# Patient Record
Sex: Female | Born: 1945
Health system: Southern US, Community
[De-identification: ages and names within clinical notes are randomized; demographics above are authoritative.]

## PROBLEM LIST (undated history)

## (undated) DIAGNOSIS — R569 Unspecified convulsions: Secondary | ICD-10-CM

## (undated) DIAGNOSIS — F32A Depression, unspecified: Secondary | ICD-10-CM

## (undated) DIAGNOSIS — K259 Gastric ulcer, unspecified as acute or chronic, without hemorrhage or perforation: Secondary | ICD-10-CM

## (undated) DIAGNOSIS — D649 Anemia, unspecified: Secondary | ICD-10-CM

## (undated) DIAGNOSIS — E785 Hyperlipidemia, unspecified: Secondary | ICD-10-CM

## (undated) DIAGNOSIS — D329 Benign neoplasm of meninges, unspecified: Secondary | ICD-10-CM

## (undated) DIAGNOSIS — K219 Gastro-esophageal reflux disease without esophagitis: Secondary | ICD-10-CM

## (undated) DIAGNOSIS — F419 Anxiety disorder, unspecified: Secondary | ICD-10-CM

## (undated) DIAGNOSIS — D126 Benign neoplasm of colon, unspecified: Secondary | ICD-10-CM

## (undated) HISTORY — PX: ESOPHAGOGASTRODUODENOSCOPY: SHX1529

## (undated) HISTORY — PX: CHOLECYSTECTOMY: SHX55

## (undated) HISTORY — PX: COLONOSCOPY WITH PROPOFOL: SHX5780

## (undated) HISTORY — PX: FRACTURE SURGERY: SHX138

## (undated) HISTORY — PX: PARTIAL GASTRECTOMY: SHX2172

---

## 2004-05-04 ENCOUNTER — Emergency Department: Payer: Self-pay | Admitting: Unknown Physician Specialty

## 2004-05-22 ENCOUNTER — Ambulatory Visit: Payer: Self-pay

## 2004-09-20 ENCOUNTER — Ambulatory Visit: Payer: Self-pay | Admitting: Internal Medicine

## 2005-05-28 ENCOUNTER — Emergency Department: Payer: Self-pay | Admitting: Emergency Medicine

## 2005-06-03 ENCOUNTER — Emergency Department: Payer: Self-pay | Admitting: Orthopedic Surgery

## 2005-06-12 ENCOUNTER — Ambulatory Visit: Payer: Self-pay | Admitting: Orthopedic Surgery

## 2005-06-25 ENCOUNTER — Encounter: Payer: Self-pay | Admitting: Orthopedic Surgery

## 2005-06-28 ENCOUNTER — Encounter: Payer: Self-pay | Admitting: Orthopedic Surgery

## 2005-07-28 ENCOUNTER — Encounter: Payer: Self-pay | Admitting: Orthopedic Surgery

## 2005-08-28 ENCOUNTER — Encounter: Payer: Self-pay | Admitting: Orthopedic Surgery

## 2005-09-28 ENCOUNTER — Encounter: Payer: Self-pay | Admitting: Orthopedic Surgery

## 2005-10-28 ENCOUNTER — Encounter: Payer: Self-pay | Admitting: Orthopedic Surgery

## 2005-11-20 ENCOUNTER — Ambulatory Visit: Payer: Self-pay | Admitting: Internal Medicine

## 2005-11-22 ENCOUNTER — Ambulatory Visit: Payer: Self-pay | Admitting: Internal Medicine

## 2005-11-28 ENCOUNTER — Encounter: Payer: Self-pay | Admitting: Orthopedic Surgery

## 2005-12-02 ENCOUNTER — Ambulatory Visit: Payer: Self-pay | Admitting: Internal Medicine

## 2006-08-14 ENCOUNTER — Ambulatory Visit: Payer: Self-pay | Admitting: Unknown Physician Specialty

## 2006-11-24 ENCOUNTER — Ambulatory Visit: Payer: Self-pay | Admitting: Internal Medicine

## 2007-11-26 ENCOUNTER — Ambulatory Visit: Payer: Self-pay | Admitting: Internal Medicine

## 2008-04-26 ENCOUNTER — Ambulatory Visit: Payer: Self-pay | Admitting: Internal Medicine

## 2008-08-19 ENCOUNTER — Ambulatory Visit: Payer: Self-pay | Admitting: Internal Medicine

## 2008-09-01 ENCOUNTER — Ambulatory Visit: Payer: Self-pay | Admitting: Neurology

## 2008-11-29 ENCOUNTER — Ambulatory Visit: Payer: Self-pay | Admitting: Internal Medicine

## 2009-11-30 ENCOUNTER — Ambulatory Visit: Payer: Self-pay | Admitting: Internal Medicine

## 2010-12-03 ENCOUNTER — Ambulatory Visit: Payer: Self-pay | Admitting: Internal Medicine

## 2011-11-18 ENCOUNTER — Ambulatory Visit: Payer: Self-pay | Admitting: Unknown Physician Specialty

## 2011-11-20 LAB — PATHOLOGY REPORT

## 2011-12-04 ENCOUNTER — Ambulatory Visit: Payer: Self-pay | Admitting: Internal Medicine

## 2012-08-05 ENCOUNTER — Ambulatory Visit: Payer: Self-pay

## 2012-08-05 LAB — CREATININE, SERUM
Creatinine: 1.25 mg/dL (ref 0.60–1.30)
EGFR (African American): 52 — ABNORMAL LOW
EGFR (Non-African Amer.): 45 — ABNORMAL LOW

## 2012-12-04 ENCOUNTER — Ambulatory Visit: Payer: Self-pay | Admitting: Internal Medicine

## 2012-12-17 ENCOUNTER — Ambulatory Visit: Payer: Self-pay | Admitting: Internal Medicine

## 2013-12-08 ENCOUNTER — Ambulatory Visit: Payer: Self-pay | Admitting: Internal Medicine

## 2013-12-17 ENCOUNTER — Ambulatory Visit: Payer: Self-pay | Admitting: Internal Medicine

## 2014-02-01 DIAGNOSIS — H1132 Conjunctival hemorrhage, left eye: Secondary | ICD-10-CM | POA: Diagnosis not present

## 2014-04-05 DIAGNOSIS — H2513 Age-related nuclear cataract, bilateral: Secondary | ICD-10-CM | POA: Diagnosis not present

## 2014-04-11 DIAGNOSIS — G40309 Generalized idiopathic epilepsy and epileptic syndromes, not intractable, without status epilepticus: Secondary | ICD-10-CM | POA: Diagnosis not present

## 2014-04-11 DIAGNOSIS — E782 Mixed hyperlipidemia: Secondary | ICD-10-CM | POA: Diagnosis not present

## 2014-04-11 DIAGNOSIS — D510 Vitamin B12 deficiency anemia due to intrinsic factor deficiency: Secondary | ICD-10-CM | POA: Diagnosis not present

## 2014-04-11 DIAGNOSIS — Z01810 Encounter for preprocedural cardiovascular examination: Secondary | ICD-10-CM | POA: Diagnosis not present

## 2014-04-14 DIAGNOSIS — H2513 Age-related nuclear cataract, bilateral: Secondary | ICD-10-CM | POA: Diagnosis not present

## 2014-04-20 ENCOUNTER — Ambulatory Visit: Payer: Self-pay | Admitting: Ophthalmology

## 2014-04-20 DIAGNOSIS — Z8673 Personal history of transient ischemic attack (TIA), and cerebral infarction without residual deficits: Secondary | ICD-10-CM | POA: Diagnosis not present

## 2014-04-20 DIAGNOSIS — Z8711 Personal history of peptic ulcer disease: Secondary | ICD-10-CM | POA: Diagnosis not present

## 2014-04-20 DIAGNOSIS — M19041 Primary osteoarthritis, right hand: Secondary | ICD-10-CM | POA: Diagnosis not present

## 2014-04-20 DIAGNOSIS — H2513 Age-related nuclear cataract, bilateral: Secondary | ICD-10-CM | POA: Diagnosis not present

## 2014-04-20 DIAGNOSIS — G43909 Migraine, unspecified, not intractable, without status migrainosus: Secondary | ICD-10-CM | POA: Diagnosis not present

## 2014-04-20 DIAGNOSIS — J40 Bronchitis, not specified as acute or chronic: Secondary | ICD-10-CM | POA: Diagnosis not present

## 2014-04-20 DIAGNOSIS — H2512 Age-related nuclear cataract, left eye: Secondary | ICD-10-CM | POA: Diagnosis not present

## 2014-04-20 DIAGNOSIS — K219 Gastro-esophageal reflux disease without esophagitis: Secondary | ICD-10-CM | POA: Diagnosis not present

## 2014-04-20 DIAGNOSIS — M19042 Primary osteoarthritis, left hand: Secondary | ICD-10-CM | POA: Diagnosis not present

## 2014-04-20 DIAGNOSIS — M19029 Primary osteoarthritis, unspecified elbow: Secondary | ICD-10-CM | POA: Diagnosis not present

## 2014-10-06 DIAGNOSIS — G40309 Generalized idiopathic epilepsy and epileptic syndromes, not intractable, without status epilepticus: Secondary | ICD-10-CM | POA: Diagnosis not present

## 2014-10-06 DIAGNOSIS — D510 Vitamin B12 deficiency anemia due to intrinsic factor deficiency: Secondary | ICD-10-CM | POA: Diagnosis not present

## 2014-10-06 DIAGNOSIS — E782 Mixed hyperlipidemia: Secondary | ICD-10-CM | POA: Diagnosis not present

## 2014-10-13 ENCOUNTER — Other Ambulatory Visit: Payer: Self-pay | Admitting: Internal Medicine

## 2014-10-13 DIAGNOSIS — D510 Vitamin B12 deficiency anemia due to intrinsic factor deficiency: Secondary | ICD-10-CM | POA: Diagnosis not present

## 2014-10-13 DIAGNOSIS — Z0000 Encounter for general adult medical examination without abnormal findings: Secondary | ICD-10-CM | POA: Diagnosis not present

## 2014-10-13 DIAGNOSIS — Z1231 Encounter for screening mammogram for malignant neoplasm of breast: Secondary | ICD-10-CM

## 2014-11-18 DIAGNOSIS — H2511 Age-related nuclear cataract, right eye: Secondary | ICD-10-CM | POA: Diagnosis not present

## 2014-12-12 ENCOUNTER — Other Ambulatory Visit: Payer: Self-pay | Admitting: Internal Medicine

## 2014-12-12 ENCOUNTER — Ambulatory Visit
Admission: RE | Admit: 2014-12-12 | Discharge: 2014-12-12 | Disposition: A | Discharge status: Home / Self Care | Payer: Commercial Managed Care - HMO | Source: Ambulatory Visit | Attending: Internal Medicine | Admitting: Internal Medicine

## 2014-12-12 DIAGNOSIS — Z1231 Encounter for screening mammogram for malignant neoplasm of breast: Secondary | ICD-10-CM | POA: Insufficient documentation

## 2014-12-14 ENCOUNTER — Other Ambulatory Visit: Payer: Self-pay | Admitting: Internal Medicine

## 2014-12-14 DIAGNOSIS — R928 Other abnormal and inconclusive findings on diagnostic imaging of breast: Secondary | ICD-10-CM

## 2014-12-20 ENCOUNTER — Ambulatory Visit
Admission: RE | Admit: 2014-12-20 | Discharge: 2014-12-20 | Disposition: A | Discharge status: Home / Self Care | Payer: Commercial Managed Care - HMO | Source: Ambulatory Visit | Attending: Internal Medicine | Admitting: Internal Medicine

## 2014-12-20 DIAGNOSIS — R928 Other abnormal and inconclusive findings on diagnostic imaging of breast: Secondary | ICD-10-CM | POA: Diagnosis not present

## 2015-04-03 DIAGNOSIS — Z0000 Encounter for general adult medical examination without abnormal findings: Secondary | ICD-10-CM | POA: Diagnosis not present

## 2015-04-03 DIAGNOSIS — D510 Vitamin B12 deficiency anemia due to intrinsic factor deficiency: Secondary | ICD-10-CM | POA: Diagnosis not present

## 2015-04-10 DIAGNOSIS — D510 Vitamin B12 deficiency anemia due to intrinsic factor deficiency: Secondary | ICD-10-CM | POA: Diagnosis not present

## 2015-04-10 DIAGNOSIS — F329 Major depressive disorder, single episode, unspecified: Secondary | ICD-10-CM | POA: Diagnosis not present

## 2015-04-10 DIAGNOSIS — G40309 Generalized idiopathic epilepsy and epileptic syndromes, not intractable, without status epilepticus: Secondary | ICD-10-CM | POA: Diagnosis not present

## 2015-04-10 DIAGNOSIS — F419 Anxiety disorder, unspecified: Secondary | ICD-10-CM | POA: Diagnosis not present

## 2015-10-09 DIAGNOSIS — D510 Vitamin B12 deficiency anemia due to intrinsic factor deficiency: Secondary | ICD-10-CM | POA: Diagnosis not present

## 2015-10-09 DIAGNOSIS — G40309 Generalized idiopathic epilepsy and epileptic syndromes, not intractable, without status epilepticus: Secondary | ICD-10-CM | POA: Diagnosis not present

## 2015-10-09 DIAGNOSIS — E782 Mixed hyperlipidemia: Secondary | ICD-10-CM | POA: Diagnosis not present

## 2015-10-17 DIAGNOSIS — Z23 Encounter for immunization: Secondary | ICD-10-CM | POA: Diagnosis not present

## 2015-10-17 DIAGNOSIS — F3341 Major depressive disorder, recurrent, in partial remission: Secondary | ICD-10-CM | POA: Diagnosis not present

## 2015-10-17 DIAGNOSIS — D510 Vitamin B12 deficiency anemia due to intrinsic factor deficiency: Secondary | ICD-10-CM | POA: Diagnosis not present

## 2015-10-17 DIAGNOSIS — Z0000 Encounter for general adult medical examination without abnormal findings: Secondary | ICD-10-CM | POA: Diagnosis not present

## 2015-10-18 ENCOUNTER — Other Ambulatory Visit: Payer: Self-pay | Admitting: Internal Medicine

## 2015-10-18 DIAGNOSIS — Z1231 Encounter for screening mammogram for malignant neoplasm of breast: Secondary | ICD-10-CM

## 2015-12-13 ENCOUNTER — Ambulatory Visit
Admission: RE | Admit: 2015-12-13 | Discharge: 2015-12-13 | Disposition: A | Discharge status: Home / Self Care | Payer: Commercial Managed Care - HMO | Source: Ambulatory Visit | Attending: Internal Medicine | Admitting: Internal Medicine

## 2015-12-13 DIAGNOSIS — Z1231 Encounter for screening mammogram for malignant neoplasm of breast: Secondary | ICD-10-CM

## 2016-04-09 DIAGNOSIS — Z0000 Encounter for general adult medical examination without abnormal findings: Secondary | ICD-10-CM | POA: Diagnosis not present

## 2016-04-09 DIAGNOSIS — D510 Vitamin B12 deficiency anemia due to intrinsic factor deficiency: Secondary | ICD-10-CM | POA: Diagnosis not present

## 2016-04-22 DIAGNOSIS — G40309 Generalized idiopathic epilepsy and epileptic syndromes, not intractable, without status epilepticus: Secondary | ICD-10-CM | POA: Diagnosis not present

## 2016-04-22 DIAGNOSIS — F3341 Major depressive disorder, recurrent, in partial remission: Secondary | ICD-10-CM | POA: Diagnosis not present

## 2016-04-22 DIAGNOSIS — D510 Vitamin B12 deficiency anemia due to intrinsic factor deficiency: Secondary | ICD-10-CM | POA: Diagnosis not present

## 2016-04-22 DIAGNOSIS — Z0000 Encounter for general adult medical examination without abnormal findings: Secondary | ICD-10-CM | POA: Diagnosis not present

## 2016-10-21 DIAGNOSIS — H26492 Other secondary cataract, left eye: Secondary | ICD-10-CM | POA: Diagnosis not present

## 2016-10-28 DIAGNOSIS — D510 Vitamin B12 deficiency anemia due to intrinsic factor deficiency: Secondary | ICD-10-CM | POA: Diagnosis not present

## 2016-10-28 DIAGNOSIS — G40309 Generalized idiopathic epilepsy and epileptic syndromes, not intractable, without status epilepticus: Secondary | ICD-10-CM | POA: Diagnosis not present

## 2016-11-04 DIAGNOSIS — Z0000 Encounter for general adult medical examination without abnormal findings: Secondary | ICD-10-CM | POA: Diagnosis not present

## 2016-11-04 DIAGNOSIS — E782 Mixed hyperlipidemia: Secondary | ICD-10-CM | POA: Diagnosis not present

## 2016-11-04 DIAGNOSIS — D510 Vitamin B12 deficiency anemia due to intrinsic factor deficiency: Secondary | ICD-10-CM | POA: Diagnosis not present

## 2016-11-05 ENCOUNTER — Other Ambulatory Visit: Payer: Self-pay | Admitting: Internal Medicine

## 2016-11-05 DIAGNOSIS — Z1231 Encounter for screening mammogram for malignant neoplasm of breast: Secondary | ICD-10-CM

## 2016-11-14 DIAGNOSIS — H26492 Other secondary cataract, left eye: Secondary | ICD-10-CM | POA: Diagnosis not present

## 2016-11-14 DIAGNOSIS — H02889 Meibomian gland dysfunction of unspecified eye, unspecified eyelid: Secondary | ICD-10-CM | POA: Diagnosis not present

## 2016-11-14 DIAGNOSIS — H04123 Dry eye syndrome of bilateral lacrimal glands: Secondary | ICD-10-CM | POA: Diagnosis not present

## 2016-11-14 DIAGNOSIS — H25811 Combined forms of age-related cataract, right eye: Secondary | ICD-10-CM | POA: Diagnosis not present

## 2016-12-16 ENCOUNTER — Ambulatory Visit
Admission: RE | Admit: 2016-12-16 | Discharge: 2016-12-16 | Disposition: A | Discharge status: Home / Self Care | Payer: Commercial Managed Care - HMO | Source: Ambulatory Visit | Attending: Internal Medicine | Admitting: Internal Medicine

## 2016-12-16 DIAGNOSIS — Z1231 Encounter for screening mammogram for malignant neoplasm of breast: Secondary | ICD-10-CM | POA: Diagnosis not present

## 2017-01-01 DIAGNOSIS — R109 Unspecified abdominal pain: Secondary | ICD-10-CM | POA: Diagnosis not present

## 2017-01-01 DIAGNOSIS — Z86010 Personal history of colonic polyps: Secondary | ICD-10-CM | POA: Diagnosis not present

## 2017-01-01 DIAGNOSIS — Z8719 Personal history of other diseases of the digestive system: Secondary | ICD-10-CM | POA: Diagnosis not present

## 2017-01-01 DIAGNOSIS — R110 Nausea: Secondary | ICD-10-CM | POA: Diagnosis not present

## 2017-01-01 DIAGNOSIS — Z8711 Personal history of peptic ulcer disease: Secondary | ICD-10-CM | POA: Diagnosis not present

## 2017-01-09 ENCOUNTER — Other Ambulatory Visit: Payer: Self-pay | Admitting: Student

## 2017-01-09 DIAGNOSIS — Z8711 Personal history of peptic ulcer disease: Secondary | ICD-10-CM

## 2017-01-09 DIAGNOSIS — Z8719 Personal history of other diseases of the digestive system: Secondary | ICD-10-CM

## 2017-01-09 DIAGNOSIS — R110 Nausea: Secondary | ICD-10-CM

## 2017-01-09 DIAGNOSIS — R109 Unspecified abdominal pain: Secondary | ICD-10-CM

## 2017-01-17 ENCOUNTER — Ambulatory Visit
Admission: RE | Admit: 2017-01-17 | Discharge: 2017-01-17 | Disposition: A | Discharge status: Home / Self Care | Payer: Medicare HMO | Source: Ambulatory Visit | Attending: Student | Admitting: Student

## 2017-01-17 DIAGNOSIS — Z903 Acquired absence of stomach [part of]: Secondary | ICD-10-CM | POA: Diagnosis not present

## 2017-01-17 DIAGNOSIS — Z8719 Personal history of other diseases of the digestive system: Secondary | ICD-10-CM | POA: Diagnosis not present

## 2017-01-17 DIAGNOSIS — R109 Unspecified abdominal pain: Secondary | ICD-10-CM

## 2017-01-17 DIAGNOSIS — R110 Nausea: Secondary | ICD-10-CM | POA: Diagnosis not present

## 2017-01-17 DIAGNOSIS — Z8711 Personal history of peptic ulcer disease: Secondary | ICD-10-CM | POA: Insufficient documentation

## 2017-01-17 LAB — POCT I-STAT CREATININE: CREATININE: 1.3 mg/dL — AB (ref 0.44–1.00)

## 2017-01-17 MED ORDER — IOPAMIDOL (ISOVUE-300) INJECTION 61%
60.0000 mL | Freq: Once | INTRAVENOUS | Status: AC | PRN
  Administered 2017-01-17: 60 mL via INTRAVENOUS

## 2017-02-05 ENCOUNTER — Other Ambulatory Visit: Payer: Self-pay | Admitting: Student

## 2017-02-05 DIAGNOSIS — K5900 Constipation, unspecified: Secondary | ICD-10-CM | POA: Diagnosis not present

## 2017-02-05 DIAGNOSIS — R1084 Generalized abdominal pain: Secondary | ICD-10-CM | POA: Diagnosis not present

## 2017-02-05 DIAGNOSIS — Z8711 Personal history of peptic ulcer disease: Secondary | ICD-10-CM | POA: Diagnosis not present

## 2017-02-05 DIAGNOSIS — R110 Nausea: Secondary | ICD-10-CM

## 2017-02-05 DIAGNOSIS — R197 Diarrhea, unspecified: Secondary | ICD-10-CM | POA: Diagnosis not present

## 2017-02-05 DIAGNOSIS — Z8719 Personal history of other diseases of the digestive system: Secondary | ICD-10-CM | POA: Diagnosis not present

## 2017-02-06 ENCOUNTER — Other Ambulatory Visit
Admission: RE | Admit: 2017-02-06 | Discharge: 2017-02-06 | Disposition: A | Discharge status: Home / Self Care | Payer: Medicare HMO | Source: Ambulatory Visit | Attending: Student | Admitting: Student

## 2017-02-06 DIAGNOSIS — R197 Diarrhea, unspecified: Secondary | ICD-10-CM | POA: Insufficient documentation

## 2017-02-06 LAB — GASTROINTESTINAL PANEL BY PCR, STOOL (REPLACES STOOL CULTURE)

## 2017-02-06 LAB — C DIFFICILE QUICK SCREEN W PCR REFLEX
C DIFFICILE (CDIFF) INTERP: NOT DETECTED
C DIFFICILE (CDIFF) TOXIN: NEGATIVE
C DIFFICLE (CDIFF) ANTIGEN: NEGATIVE

## 2017-02-18 ENCOUNTER — Ambulatory Visit
Admission: RE | Admit: 2017-02-18 | Discharge: 2017-02-18 | Disposition: A | Discharge status: Home / Self Care | Payer: Medicare HMO | Source: Ambulatory Visit | Attending: Student | Admitting: Student

## 2017-02-18 DIAGNOSIS — Z903 Acquired absence of stomach [part of]: Secondary | ICD-10-CM | POA: Diagnosis not present

## 2017-02-18 DIAGNOSIS — R110 Nausea: Secondary | ICD-10-CM | POA: Insufficient documentation

## 2017-02-18 DIAGNOSIS — R197 Diarrhea, unspecified: Secondary | ICD-10-CM | POA: Insufficient documentation

## 2017-02-18 DIAGNOSIS — R1084 Generalized abdominal pain: Secondary | ICD-10-CM | POA: Diagnosis not present

## 2017-02-18 DIAGNOSIS — R109 Unspecified abdominal pain: Secondary | ICD-10-CM | POA: Diagnosis not present

## 2017-03-24 ENCOUNTER — Encounter
Admission: RE | Disposition: A | Discharge status: Home / Self Care | Payer: Self-pay | Source: Ambulatory Visit | Attending: Unknown Physician Specialty

## 2017-03-24 ENCOUNTER — Ambulatory Visit
Admission: RE | Admit: 2017-03-24 | Discharge: 2017-03-24 | Disposition: A | Discharge status: Home / Self Care | Payer: Medicare HMO | Source: Ambulatory Visit | Attending: Unknown Physician Specialty | Admitting: Unknown Physician Specialty

## 2017-03-24 ENCOUNTER — Ambulatory Visit: Payer: Medicare HMO | Admitting: Anesthesiology

## 2017-03-24 ENCOUNTER — Encounter: Payer: Self-pay | Admitting: *Deleted

## 2017-03-24 DIAGNOSIS — Z79899 Other long term (current) drug therapy: Secondary | ICD-10-CM | POA: Insufficient documentation

## 2017-03-24 DIAGNOSIS — K3189 Other diseases of stomach and duodenum: Secondary | ICD-10-CM | POA: Diagnosis not present

## 2017-03-24 DIAGNOSIS — Z1211 Encounter for screening for malignant neoplasm of colon: Secondary | ICD-10-CM | POA: Diagnosis not present

## 2017-03-24 DIAGNOSIS — K2970 Gastritis, unspecified, without bleeding: Secondary | ICD-10-CM | POA: Diagnosis not present

## 2017-03-24 DIAGNOSIS — K635 Polyp of colon: Secondary | ICD-10-CM | POA: Diagnosis not present

## 2017-03-24 DIAGNOSIS — Z86010 Personal history of colonic polyps: Secondary | ICD-10-CM | POA: Insufficient documentation

## 2017-03-24 DIAGNOSIS — D123 Benign neoplasm of transverse colon: Secondary | ICD-10-CM | POA: Insufficient documentation

## 2017-03-24 DIAGNOSIS — Z8719 Personal history of other diseases of the digestive system: Secondary | ICD-10-CM | POA: Insufficient documentation

## 2017-03-24 DIAGNOSIS — K219 Gastro-esophageal reflux disease without esophagitis: Secondary | ICD-10-CM | POA: Insufficient documentation

## 2017-03-24 DIAGNOSIS — E785 Hyperlipidemia, unspecified: Secondary | ICD-10-CM | POA: Insufficient documentation

## 2017-03-24 DIAGNOSIS — D124 Benign neoplasm of descending colon: Secondary | ICD-10-CM | POA: Diagnosis not present

## 2017-03-24 DIAGNOSIS — K2950 Unspecified chronic gastritis without bleeding: Secondary | ICD-10-CM | POA: Diagnosis not present

## 2017-03-24 HISTORY — DX: Anemia, unspecified: D64.9

## 2017-03-24 HISTORY — DX: Unspecified convulsions: R56.9

## 2017-03-24 HISTORY — DX: Hyperlipidemia, unspecified: E78.5

## 2017-03-24 HISTORY — DX: Benign neoplasm of meninges, unspecified: D32.9

## 2017-03-24 HISTORY — DX: Benign neoplasm of colon, unspecified: D12.6

## 2017-03-24 HISTORY — PX: COLONOSCOPY WITH PROPOFOL: SHX5780

## 2017-03-24 HISTORY — DX: Gastric ulcer, unspecified as acute or chronic, without hemorrhage or perforation: K25.9

## 2017-03-24 HISTORY — PX: ESOPHAGOGASTRODUODENOSCOPY (EGD) WITH PROPOFOL: SHX5813

## 2017-03-24 SURGERY — ESOPHAGOGASTRODUODENOSCOPY (EGD) WITH PROPOFOL
Anesthesia: General

## 2017-03-24 MED ORDER — SODIUM CHLORIDE 0.9 % IV SOLN: INTRAVENOUS | Status: DC

## 2017-03-24 MED ORDER — PROPOFOL 10 MG/ML IV BOLUS
INTRAVENOUS | Status: DC | PRN
  Administered 2017-03-24: 20 mg via INTRAVENOUS
  Administered 2017-03-24: 40 mg via INTRAVENOUS

## 2017-03-24 MED ORDER — PROPOFOL 500 MG/50ML IV EMUL
INTRAVENOUS | Status: DC | PRN
  Administered 2017-03-24: 150 ug/kg/min via INTRAVENOUS

## 2017-03-24 MED ORDER — LIDOCAINE HCL (CARDIAC) 20 MG/ML IV SOLN
INTRAVENOUS | Status: DC | PRN
  Administered 2017-03-24: 40 mg via INTRAVENOUS

## 2017-03-24 MED ORDER — SODIUM CHLORIDE 0.9 % IV SOLN
INTRAVENOUS | Status: DC
  Administered 2017-03-24 (×2): via INTRAVENOUS

## 2017-03-24 MED ORDER — MIDAZOLAM HCL 2 MG/2ML IJ SOLN
INTRAMUSCULAR | Status: DC | PRN
  Administered 2017-03-24: 1 mg via INTRAVENOUS

## 2017-03-24 MED ORDER — EPHEDRINE SULFATE 50 MG/ML IJ SOLN
INTRAMUSCULAR | Status: DC | PRN
  Administered 2017-03-24: 10 mg via INTRAVENOUS

## 2017-03-24 MED ORDER — FENTANYL CITRATE (PF) 100 MCG/2ML IJ SOLN
INTRAMUSCULAR | Status: DC | PRN
  Administered 2017-03-24: 50 ug via INTRAVENOUS

## 2017-03-24 MED ORDER — PHENYLEPHRINE HCL 10 MG/ML IJ SOLN
INTRAMUSCULAR | Status: DC | PRN
  Administered 2017-03-24 (×4): 100 ug via INTRAVENOUS

## 2017-03-24 MED ORDER — MIDAZOLAM HCL 2 MG/2ML IJ SOLN
INTRAMUSCULAR | Status: AC
  Filled 2017-03-24: qty 2

## 2017-03-24 MED ORDER — FENTANYL CITRATE (PF) 100 MCG/2ML IJ SOLN
INTRAMUSCULAR | Status: AC
  Filled 2017-03-24: qty 2

## 2017-03-24 MED ORDER — PROPOFOL 500 MG/50ML IV EMUL
INTRAVENOUS | Status: AC
  Filled 2017-03-24: qty 50

## 2017-03-24 NOTE — Op Note (Signed)
[facility] Gastroenterology Patient Name: Donna Richardson Procedure Date: 03/24/2017 3:03 PM MRN: [id number] Account #: [id number] Date of Birth: 03/31/1945 Admit Type: Outpatient Age: 72 Room: [facility] ENDO ROOM 3 Gender: Female Note Status: Finalized Procedure:            Upper GI endoscopy Indications:          Abdominal pain in the left upper quadrant Providers:            Manya Silvas, MD Referring MD:         Rusty Aus, MD (Referring MD) Medicines:            Propofol per Anesthesia Complications:        No immediate complications. Procedure:            Pre-Anesthesia Assessment:                       - After reviewing the risks and benefits, the patient                        was deemed in satisfactory condition to undergo the                        procedure.                       After obtaining informed consent, the endoscope was                        passed under direct vision. Throughout the procedure,                        the patient's blood pressure, pulse, and oxygen                        saturations were monitored continuously. The Endoscope                        was introduced through the mouth, and advanced to the                        jejunum. The upper GI endoscopy was accomplished                        without difficulty. The patient tolerated the procedure                        well. Findings:      The examined esophagus was normal. GEJ 39cm.      Diffuse mild inflammation characterized by erythema and granularity was       found in the gastric body. Merely washing with the scoipe button made       the mucosal lining bleed some. Biopsies were taken with a cold forceps       for histology. Biopsies were taken with a cold forceps for Helicobacter       pylori testing. The small intestine was normal. Impression:           - Normal esophagus.                       - Gastritis. Biopsied. Recommendation:       -  Await pathology  results. Manya Silvas, MD 03/24/2017 3:25:56 PM This report has been signed electronically. Number of Addenda: 0 Note Initiated On: 03/24/2017 3:03 PM      [facility]

## 2017-03-24 NOTE — Anesthesia Postprocedure Evaluation (Signed)
Anesthesia Post Note  Patient: Donna Richardson  Procedure(s) Performed: ESOPHAGOGASTRODUODENOSCOPY (EGD) WITH PROPOFOL (N/A ) COLONOSCOPY WITH PROPOFOL (N/A )  Patient location during evaluation: PACU Anesthesia Type: General Level of consciousness: awake and alert and oriented Pain management: pain level controlled Vital Signs Assessment: post-procedure vital signs reviewed and stable Respiratory status: spontaneous breathing Cardiovascular status: blood pressure returned to baseline Anesthetic complications: no     Last Vitals:  Vitals:   03/24/17 1620 03/24/17 1640  BP: 116/60 (!) 77/64  Pulse: 78 76  Resp: (!) 21 15  Temp:    SpO2: 100% 100%    Last Pain:  Vitals:   03/24/17 1601  TempSrc: Tympanic                 Kalis Friese

## 2017-03-24 NOTE — Transfer of Care (Signed)
Immediate Anesthesia Transfer of Care Note  Patient: Donna Richardson  Procedure(s) Performed: ESOPHAGOGASTRODUODENOSCOPY (EGD) WITH PROPOFOL (N/A ) COLONOSCOPY WITH PROPOFOL (N/A )  Patient Location: Endoscopy Unit  Anesthesia Type:General  Level of Consciousness: awake  Airway & Oxygen Therapy: Patient connected to nasal cannula oxygen  Post-op Assessment: Post -op Vital signs reviewed and stable  Post vital signs: stable  Last Vitals:  Vitals:   03/24/17 1600 03/24/17 1601  BP: (!) 99/40 (!) 99/40  Pulse: 77 78  Resp: 18 15  Temp: (!) 35.9 C (!) 35.9 C  SpO2: 100% 100%    Last Pain:  Vitals:   03/24/17 1601  TempSrc: Tympanic         Complications: No apparent anesthesia complications

## 2017-03-24 NOTE — Op Note (Signed)
[facility] Gastroenterology Patient Name: Donna Richardson Procedure Date: 03/24/2017 3:03 PM MRN: [id number] Account #: [id number] Date of Birth: 04/13/1945 Admit Type: Outpatient Age: 72 Room: [facility] ENDO ROOM 3 Gender: Female Note Status: Finalized Procedure:            Colonoscopy Indications:          High risk colon cancer surveillance: Personal history                        of colonic polyps Providers:            Manya Silvas, MD Referring MD:         Rusty Aus, MD (Referring MD) Medicines:            Propofol per Anesthesia Complications:        No immediate complications. Procedure:            Pre-Anesthesia Assessment:                       - After reviewing the risks and benefits, the patient                        was deemed in satisfactory condition to undergo the                        procedure.                       After obtaining informed consent, the colonoscope was                        passed under direct vision. Throughout the procedure,                        the patient's blood pressure, pulse, and oxygen                        saturations were monitored continuously. The                        Colonoscope was introduced through the anus and                        advanced to the the cecum, identified by appendiceal                        orifice and ileocecal valve. The colonoscopy was                        performed without difficulty. The patient tolerated the                        procedure well. The quality of the bowel preparation                        was good. Findings:      A small polyp was found in the descending colon. The polyp was sessile.       The polyp was removed with a hot snare. Resection and retrieval were       complete.      A small polyp was found  in the transverse colon. The polyp was sessile.       The polyp was removed with a hot snare. Resection and retrieval were       complete. Impression:            - One small polyp in the descending colon, removed with                        a hot snare. Resected and retrieved.                       - One small polyp in the transverse colon, removed with                        a hot snare. Resected and retrieved. Recommendation:       - Await pathology results. Manya Silvas, MD 03/24/2017 3:58:54 PM This report has been signed electronically. Number of Addenda: 0 Note Initiated On: 03/24/2017 3:03 PM Scope Withdrawal Time: 0 hours 12 minutes 55 seconds  Total Procedure Duration: 0 hours 27 minutes 41 seconds       [facility]

## 2017-03-24 NOTE — Anesthesia Post-op Follow-up Note (Signed)
Anesthesia QCDR form completed.        

## 2017-03-24 NOTE — Anesthesia Preprocedure Evaluation (Addendum)
Anesthesia Evaluation  Patient identified by MRN, date of birth, ID band Patient awake    Reviewed: Allergy & Precautions, NPO status , Patient's Chart, lab work & pertinent test results  History of Anesthesia Complications Negative for: history of anesthetic complications  Airway Mallampati: III       Dental  (+) Lower Dentures, Upper Dentures   Pulmonary neg sleep apnea, neg COPD,           Cardiovascular (-) hypertension(-) Past MI and (-) CHF (-) dysrhythmias (-) Valvular Problems/Murmurs     Neuro/Psych Seizures - (last 9 years ago), Well Controlled,     GI/Hepatic Neg liver ROS, PUD, GERD  Medicated and Controlled,  Endo/Other  neg diabetes  Renal/GU negative Renal ROS     Musculoskeletal   Abdominal   Peds  Hematology  (+) anemia ,   Anesthesia Other Findings   Reproductive/Obstetrics                            Anesthesia Physical Anesthesia Plan  ASA: II  Anesthesia Plan: General   Post-op Pain Management:    Induction: Intravenous  PONV Risk Score and Plan: 3 and TIVA, Propofol infusion and Ondansetron  Airway Management Planned: Nasal Cannula  Additional Equipment:   Intra-op Plan:   Post-operative Plan:   Informed Consent: I have reviewed the patients History and Physical, chart, labs and discussed the procedure including the risks, benefits and alternatives for the proposed anesthesia with the patient or authorized representative who has indicated his/her understanding and acceptance.     Plan Discussed with:   Anesthesia Plan Comments:         Anesthesia Quick Evaluation

## 2017-03-24 NOTE — H&P (Signed)
Primary Care Physician:  Rusty Aus, MD Primary Gastroenterologist:  Dr. Vira Agar  Pre-Procedure History & Physical: HPI:  Donna Richardson is a 72 y.o. female is here for an colonoscopy and an EGD.  This is for abdominal pain and nausea and PH colon polyps.   Past Medical History:  Diagnosis Date  . Anemia   . Colon adenomas   . Gastric ulcer   . Hyperlipidemia   . Meningioma ([place])   . Seizures ([place])     Past Surgical History:  Procedure Laterality Date  . CHOLECYSTECTOMY    . COLONOSCOPY WITH PROPOFOL    . ESOPHAGOGASTRODUODENOSCOPY      Prior to Admission medications   Medication Sig Start Date End Date Taking? Authorizing Provider  amitriptyline (ELAVIL) 25 MG tablet Take 25 mg by mouth at bedtime.   Yes [provider]  Ascorbic Acid (VITAMIN C) 1000 MG tablet Take 1,000 mg by mouth daily.   Yes [provider]  Calcium Carbonate-Vitamin D (CALTRATE 600+D PO) Take by mouth 2 (two) times daily.   Yes [provider]  Cyanocobalamin (VITAMIN B-12) 2500 MCG SUBL Place under the tongue daily.   Yes [provider]  cyclobenzaprine (FLEXERIL) 10 MG tablet Take 10 mg by mouth daily.   Yes [provider]  fluticasone (FLONASE) 50 MCG/ACT nasal spray Place into both nostrils daily.   Yes [provider]  ondansetron (ZOFRAN) 4 MG tablet Take 4 mg by mouth every 8 (eight) hours as needed for nausea or vomiting.   Yes [provider]  potassium chloride SA (K-DUR,KLOR-CON) 20 MEQ tablet Take 20 mEq by mouth daily.   Yes [provider]  propranolol (INDERAL) 40 MG tablet Take 40 mg by mouth daily.   Yes [provider]  topiramate (TOPAMAX) 50 MG tablet Take 50 mg by mouth 2 (two) times daily.   Yes [provider]  vitamin E 1000 UNIT capsule Take 1,000 Units by mouth daily.   Yes [provider]  zolpidem (AMBIEN) 10 MG tablet Take 10 mg by mouth at bedtime.   Yes [provider]  pantoprazole (PROTONIX) 40 MG tablet Take 40 mg by mouth 2 (two) times daily.    [provider]  ranitidine (ZANTAC) 150 MG tablet Take 300 mg by mouth at bedtime.    [provider]    Allergies as of 01/13/2017  . (Not on File)    Family History  Problem Relation Age of Onset  . Breast cancer Sister 35    Social History   Socioeconomic History  . Marital status: Single    Spouse name: Not on file  . Number of children: Not on file  . Years of education: Not on file  . Highest education level: Not on file  Social Needs  . Financial resource strain: Not on file  . Food insecurity - worry: Not on file  . Food insecurity - inability: Not on file  . Transportation needs - medical: Not on file  . Transportation needs - non-medical: Not on file  Occupational History  . Not on file  Tobacco Use  . Smoking status: Never Smoker  . Smokeless tobacco: Never Used  Substance and Sexual Activity  . Alcohol use: No    Frequency: Never  . Drug use: No  . Sexual activity: Not on file  Other Topics Concern  . Not on file  Social History Narrative  . Not on file    Review  of Systems: See HPI, otherwise negative ROS  Physical Exam: BP (!) 133/47   Pulse 80   Temp 97.9 F (36.6 C) (Oral)   Resp 16   Ht 5\' 5"  (1.651 m)   Wt 51.3 kg (113 lb)   SpO2 100%   BMI 18.80 kg/m  General:   Alert,  pleasant and cooperative in NAD Head:  Normocephalic and atraumatic. Neck:  Supple; no masses or thyromegaly. Lungs:  Clear throughout to auscultation.    Heart:  Regular rate and rhythm. Abdomen:  Soft, nontender and nondistended. Normal bowel sounds, without guarding, and without rebound.   Neurologic:  Alert and  oriented x4;  grossly normal neurologically.  Impression/Plan: Donna Richardson is here for an endoscopy and colonoscopy to be performed for L abdominal pain,, nausea, PH colon polyps  Risks, benefits, limitations, and alternatives  regarding  Endoscopy and  colonoscopy have been reviewed with the patient.  Questions have been answered.  All parties agreeable.   Gaylyn Cheers, MD  03/24/2017, 3:07 PM

## 2017-03-25 ENCOUNTER — Encounter: Payer: Self-pay | Admitting: Unknown Physician Specialty

## 2017-03-28 LAB — SURGICAL PATHOLOGY

## 2017-04-28 DIAGNOSIS — D510 Vitamin B12 deficiency anemia due to intrinsic factor deficiency: Secondary | ICD-10-CM | POA: Diagnosis not present

## 2017-05-05 DIAGNOSIS — Z79899 Other long term (current) drug therapy: Secondary | ICD-10-CM | POA: Diagnosis not present

## 2017-05-05 DIAGNOSIS — D510 Vitamin B12 deficiency anemia due to intrinsic factor deficiency: Secondary | ICD-10-CM | POA: Diagnosis not present

## 2017-05-05 DIAGNOSIS — K2970 Gastritis, unspecified, without bleeding: Secondary | ICD-10-CM | POA: Diagnosis not present

## 2017-05-05 DIAGNOSIS — E782 Mixed hyperlipidemia: Secondary | ICD-10-CM | POA: Diagnosis not present

## 2017-05-05 DIAGNOSIS — R110 Nausea: Secondary | ICD-10-CM | POA: Diagnosis not present

## 2017-05-05 DIAGNOSIS — G40309 Generalized idiopathic epilepsy and epileptic syndromes, not intractable, without status epilepticus: Secondary | ICD-10-CM | POA: Diagnosis not present

## 2017-05-05 DIAGNOSIS — Z0000 Encounter for general adult medical examination without abnormal findings: Secondary | ICD-10-CM | POA: Diagnosis not present

## 2017-06-27 DIAGNOSIS — E538 Deficiency of other specified B group vitamins: Secondary | ICD-10-CM | POA: Diagnosis not present

## 2017-07-28 DIAGNOSIS — D510 Vitamin B12 deficiency anemia due to intrinsic factor deficiency: Secondary | ICD-10-CM | POA: Diagnosis not present

## 2017-10-30 DIAGNOSIS — Z79899 Other long term (current) drug therapy: Secondary | ICD-10-CM | POA: Diagnosis not present

## 2017-10-30 DIAGNOSIS — D510 Vitamin B12 deficiency anemia due to intrinsic factor deficiency: Secondary | ICD-10-CM | POA: Diagnosis not present

## 2017-10-30 DIAGNOSIS — E782 Mixed hyperlipidemia: Secondary | ICD-10-CM | POA: Diagnosis not present

## 2017-11-06 DIAGNOSIS — R634 Abnormal weight loss: Secondary | ICD-10-CM | POA: Diagnosis not present

## 2017-11-06 DIAGNOSIS — K2970 Gastritis, unspecified, without bleeding: Secondary | ICD-10-CM | POA: Diagnosis not present

## 2017-11-06 DIAGNOSIS — Z0000 Encounter for general adult medical examination without abnormal findings: Secondary | ICD-10-CM | POA: Diagnosis not present

## 2017-11-06 DIAGNOSIS — D510 Vitamin B12 deficiency anemia due to intrinsic factor deficiency: Secondary | ICD-10-CM | POA: Diagnosis not present

## 2017-11-06 DIAGNOSIS — G40309 Generalized idiopathic epilepsy and epileptic syndromes, not intractable, without status epilepticus: Secondary | ICD-10-CM | POA: Diagnosis not present

## 2017-11-06 DIAGNOSIS — E782 Mixed hyperlipidemia: Secondary | ICD-10-CM | POA: Diagnosis not present

## 2017-11-06 DIAGNOSIS — Z23 Encounter for immunization: Secondary | ICD-10-CM | POA: Diagnosis not present

## 2017-11-06 DIAGNOSIS — R110 Nausea: Secondary | ICD-10-CM | POA: Diagnosis not present

## 2018-01-05 DIAGNOSIS — D510 Vitamin B12 deficiency anemia due to intrinsic factor deficiency: Secondary | ICD-10-CM | POA: Diagnosis not present

## 2018-01-05 DIAGNOSIS — R634 Abnormal weight loss: Secondary | ICD-10-CM | POA: Diagnosis not present

## 2018-02-05 DIAGNOSIS — D510 Vitamin B12 deficiency anemia due to intrinsic factor deficiency: Secondary | ICD-10-CM | POA: Diagnosis not present

## 2018-03-10 DIAGNOSIS — D510 Vitamin B12 deficiency anemia due to intrinsic factor deficiency: Secondary | ICD-10-CM | POA: Diagnosis not present

## 2018-04-10 DIAGNOSIS — E538 Deficiency of other specified B group vitamins: Secondary | ICD-10-CM | POA: Diagnosis not present

## 2018-04-10 DIAGNOSIS — D510 Vitamin B12 deficiency anemia due to intrinsic factor deficiency: Secondary | ICD-10-CM | POA: Diagnosis not present

## 2018-05-04 DIAGNOSIS — D510 Vitamin B12 deficiency anemia due to intrinsic factor deficiency: Secondary | ICD-10-CM | POA: Diagnosis not present

## 2018-05-04 DIAGNOSIS — E782 Mixed hyperlipidemia: Secondary | ICD-10-CM | POA: Diagnosis not present

## 2018-05-11 DIAGNOSIS — D510 Vitamin B12 deficiency anemia due to intrinsic factor deficiency: Secondary | ICD-10-CM | POA: Diagnosis not present

## 2018-05-11 DIAGNOSIS — E782 Mixed hyperlipidemia: Secondary | ICD-10-CM | POA: Diagnosis not present

## 2018-05-11 DIAGNOSIS — Z0000 Encounter for general adult medical examination without abnormal findings: Secondary | ICD-10-CM | POA: Diagnosis not present

## 2018-05-11 DIAGNOSIS — G40309 Generalized idiopathic epilepsy and epileptic syndromes, not intractable, without status epilepticus: Secondary | ICD-10-CM | POA: Diagnosis not present

## 2018-05-26 DIAGNOSIS — K2970 Gastritis, unspecified, without bleeding: Secondary | ICD-10-CM | POA: Diagnosis not present

## 2018-05-26 DIAGNOSIS — R110 Nausea: Secondary | ICD-10-CM | POA: Diagnosis not present

## 2018-06-11 DIAGNOSIS — D510 Vitamin B12 deficiency anemia due to intrinsic factor deficiency: Secondary | ICD-10-CM | POA: Diagnosis not present

## 2018-07-13 DIAGNOSIS — D510 Vitamin B12 deficiency anemia due to intrinsic factor deficiency: Secondary | ICD-10-CM | POA: Diagnosis not present

## 2018-07-20 DIAGNOSIS — Z961 Presence of intraocular lens: Secondary | ICD-10-CM | POA: Diagnosis not present

## 2018-07-20 DIAGNOSIS — Z01818 Encounter for other preprocedural examination: Secondary | ICD-10-CM | POA: Diagnosis not present

## 2018-07-20 DIAGNOSIS — H25811 Combined forms of age-related cataract, right eye: Secondary | ICD-10-CM | POA: Diagnosis not present

## 2018-08-13 DIAGNOSIS — D510 Vitamin B12 deficiency anemia due to intrinsic factor deficiency: Secondary | ICD-10-CM | POA: Diagnosis not present

## 2018-08-26 DIAGNOSIS — H2511 Age-related nuclear cataract, right eye: Secondary | ICD-10-CM | POA: Diagnosis not present

## 2018-08-26 DIAGNOSIS — H25811 Combined forms of age-related cataract, right eye: Secondary | ICD-10-CM | POA: Diagnosis not present

## 2018-09-14 DIAGNOSIS — D510 Vitamin B12 deficiency anemia due to intrinsic factor deficiency: Secondary | ICD-10-CM | POA: Diagnosis not present

## 2018-10-15 DIAGNOSIS — D510 Vitamin B12 deficiency anemia due to intrinsic factor deficiency: Secondary | ICD-10-CM | POA: Diagnosis not present

## 2018-11-05 DIAGNOSIS — E782 Mixed hyperlipidemia: Secondary | ICD-10-CM | POA: Diagnosis not present

## 2018-11-05 DIAGNOSIS — D510 Vitamin B12 deficiency anemia due to intrinsic factor deficiency: Secondary | ICD-10-CM | POA: Diagnosis not present

## 2018-11-12 DIAGNOSIS — G4700 Insomnia, unspecified: Secondary | ICD-10-CM | POA: Diagnosis not present

## 2018-11-12 DIAGNOSIS — G43909 Migraine, unspecified, not intractable, without status migrainosus: Secondary | ICD-10-CM | POA: Diagnosis not present

## 2018-11-12 DIAGNOSIS — Z79899 Other long term (current) drug therapy: Secondary | ICD-10-CM | POA: Diagnosis not present

## 2018-11-12 DIAGNOSIS — Z0000 Encounter for general adult medical examination without abnormal findings: Secondary | ICD-10-CM | POA: Diagnosis not present

## 2018-11-12 DIAGNOSIS — M1990 Unspecified osteoarthritis, unspecified site: Secondary | ICD-10-CM | POA: Diagnosis not present

## 2018-11-12 DIAGNOSIS — E538 Deficiency of other specified B group vitamins: Secondary | ICD-10-CM | POA: Diagnosis not present

## 2018-11-24 DIAGNOSIS — R140 Abdominal distension (gaseous): Secondary | ICD-10-CM | POA: Diagnosis not present

## 2018-11-24 DIAGNOSIS — K2970 Gastritis, unspecified, without bleeding: Secondary | ICD-10-CM | POA: Diagnosis not present

## 2018-11-24 DIAGNOSIS — R110 Nausea: Secondary | ICD-10-CM | POA: Diagnosis not present

## 2018-12-14 DIAGNOSIS — D510 Vitamin B12 deficiency anemia due to intrinsic factor deficiency: Secondary | ICD-10-CM | POA: Diagnosis not present

## 2019-01-14 DIAGNOSIS — D510 Vitamin B12 deficiency anemia due to intrinsic factor deficiency: Secondary | ICD-10-CM | POA: Diagnosis not present

## 2019-02-15 DIAGNOSIS — D510 Vitamin B12 deficiency anemia due to intrinsic factor deficiency: Secondary | ICD-10-CM | POA: Diagnosis not present

## 2019-03-17 DIAGNOSIS — D510 Vitamin B12 deficiency anemia due to intrinsic factor deficiency: Secondary | ICD-10-CM | POA: Diagnosis not present

## 2019-03-26 ENCOUNTER — Ambulatory Visit: Payer: Medicare HMO | Attending: Internal Medicine

## 2019-03-26 DIAGNOSIS — Z23 Encounter for immunization: Secondary | ICD-10-CM | POA: Insufficient documentation

## 2019-03-26 NOTE — Progress Notes (Signed)
   Covid-19 Vaccination Clinic  Name:  Donna Richardson    MRN: [id number] DOB: [birth year]  03/26/2019  Donna Richardson was observed post Covid-19 immunization for 15 minutes without incidence. She was provided with Vaccine Information Sheet and instruction to access the V-Safe system.   Donna Richardson was instructed to call 911 with any severe reactions post vaccine: Marland Kitchen Difficulty breathing  . Swelling of your face and throat  . A fast heartbeat  . A bad rash all over your body  . Dizziness and weakness    Immunizations Administered    Name Date Dose VIS Date Route   Pfizer COVID-19 Vaccine 03/26/2019 12:31 PM 0.3 mL 01/08/2019 Intramuscular   Manufacturer: [place]   Lot: [id number]   [place]: [id number]

## 2019-04-19 DIAGNOSIS — D510 Vitamin B12 deficiency anemia due to intrinsic factor deficiency: Secondary | ICD-10-CM | POA: Diagnosis not present

## 2019-04-27 ENCOUNTER — Ambulatory Visit: Payer: Medicare HMO | Attending: Internal Medicine

## 2019-04-27 DIAGNOSIS — Z23 Encounter for immunization: Secondary | ICD-10-CM

## 2019-04-27 NOTE — Progress Notes (Signed)
   Covid-19 Vaccination Clinic  Name:  Donna Richardson    MRN: [id number] DOB: 01-09-1946  04/27/2019  Donna Richardson was observed post Covid-19 immunization for 15 minutes without incident. She was provided with Vaccine Information Sheet and instruction to access the V-Safe system.   Donna Richardson was instructed to call 911 with any severe reactions post vaccine: Marland Kitchen Difficulty breathing  . Swelling of face and throat  . A fast heartbeat  . A bad rash all over body  . Dizziness and weakness   Immunizations Administered    Name Date Dose VIS Date Route   Pfizer COVID-19 Vaccine 04/27/2019 10:40 AM 0.3 mL 01/08/2019 Intramuscular   Manufacturer: [place]   Lot: [id number]   [place]: [id number]

## 2019-05-06 DIAGNOSIS — E782 Mixed hyperlipidemia: Secondary | ICD-10-CM | POA: Diagnosis not present

## 2019-05-06 DIAGNOSIS — D510 Vitamin B12 deficiency anemia due to intrinsic factor deficiency: Secondary | ICD-10-CM | POA: Diagnosis not present

## 2019-05-13 DIAGNOSIS — R140 Abdominal distension (gaseous): Secondary | ICD-10-CM | POA: Diagnosis not present

## 2019-05-13 DIAGNOSIS — K2970 Gastritis, unspecified, without bleeding: Secondary | ICD-10-CM | POA: Diagnosis not present

## 2019-05-13 DIAGNOSIS — R519 Headache, unspecified: Secondary | ICD-10-CM | POA: Diagnosis not present

## 2019-05-13 DIAGNOSIS — K219 Gastro-esophageal reflux disease without esophagitis: Secondary | ICD-10-CM | POA: Diagnosis not present

## 2019-05-13 DIAGNOSIS — E538 Deficiency of other specified B group vitamins: Secondary | ICD-10-CM | POA: Diagnosis not present

## 2019-05-13 DIAGNOSIS — F329 Major depressive disorder, single episode, unspecified: Secondary | ICD-10-CM | POA: Diagnosis not present

## 2019-05-13 DIAGNOSIS — G4700 Insomnia, unspecified: Secondary | ICD-10-CM | POA: Diagnosis not present

## 2019-05-13 DIAGNOSIS — Z0000 Encounter for general adult medical examination without abnormal findings: Secondary | ICD-10-CM | POA: Diagnosis not present

## 2019-05-13 DIAGNOSIS — R110 Nausea: Secondary | ICD-10-CM | POA: Diagnosis not present

## 2019-05-20 DIAGNOSIS — D510 Vitamin B12 deficiency anemia due to intrinsic factor deficiency: Secondary | ICD-10-CM | POA: Diagnosis not present

## 2019-05-27 DIAGNOSIS — M3501 Sicca syndrome with keratoconjunctivitis: Secondary | ICD-10-CM | POA: Diagnosis not present

## 2019-06-22 DIAGNOSIS — D510 Vitamin B12 deficiency anemia due to intrinsic factor deficiency: Secondary | ICD-10-CM | POA: Diagnosis not present

## 2019-07-23 DIAGNOSIS — E538 Deficiency of other specified B group vitamins: Secondary | ICD-10-CM | POA: Diagnosis not present

## 2019-07-23 DIAGNOSIS — D510 Vitamin B12 deficiency anemia due to intrinsic factor deficiency: Secondary | ICD-10-CM | POA: Diagnosis not present

## 2019-07-28 DIAGNOSIS — M3501 Sicca syndrome with keratoconjunctivitis: Secondary | ICD-10-CM | POA: Diagnosis not present

## 2019-08-01 DIAGNOSIS — J329 Chronic sinusitis, unspecified: Secondary | ICD-10-CM | POA: Diagnosis not present

## 2019-08-23 DIAGNOSIS — D510 Vitamin B12 deficiency anemia due to intrinsic factor deficiency: Secondary | ICD-10-CM | POA: Diagnosis not present

## 2019-09-23 DIAGNOSIS — D510 Vitamin B12 deficiency anemia due to intrinsic factor deficiency: Secondary | ICD-10-CM | POA: Diagnosis not present

## 2019-10-25 DIAGNOSIS — D510 Vitamin B12 deficiency anemia due to intrinsic factor deficiency: Secondary | ICD-10-CM | POA: Diagnosis not present

## 2019-10-25 DIAGNOSIS — Z23 Encounter for immunization: Secondary | ICD-10-CM | POA: Diagnosis not present

## 2019-11-09 DIAGNOSIS — E782 Mixed hyperlipidemia: Secondary | ICD-10-CM | POA: Diagnosis not present

## 2019-11-09 DIAGNOSIS — D510 Vitamin B12 deficiency anemia due to intrinsic factor deficiency: Secondary | ICD-10-CM | POA: Diagnosis not present

## 2019-11-16 DIAGNOSIS — Z0000 Encounter for general adult medical examination without abnormal findings: Secondary | ICD-10-CM | POA: Diagnosis not present

## 2019-11-16 DIAGNOSIS — E538 Deficiency of other specified B group vitamins: Secondary | ICD-10-CM | POA: Diagnosis not present

## 2019-11-16 DIAGNOSIS — K279 Peptic ulcer, site unspecified, unspecified as acute or chronic, without hemorrhage or perforation: Secondary | ICD-10-CM | POA: Diagnosis not present

## 2019-11-16 DIAGNOSIS — F32A Depression, unspecified: Secondary | ICD-10-CM | POA: Diagnosis not present

## 2019-11-26 DIAGNOSIS — D510 Vitamin B12 deficiency anemia due to intrinsic factor deficiency: Secondary | ICD-10-CM | POA: Diagnosis not present

## 2019-12-21 DIAGNOSIS — R110 Nausea: Secondary | ICD-10-CM | POA: Diagnosis not present

## 2019-12-21 DIAGNOSIS — K580 Irritable bowel syndrome with diarrhea: Secondary | ICD-10-CM | POA: Diagnosis not present

## 2019-12-21 DIAGNOSIS — Z980 Intestinal bypass and anastomosis status: Secondary | ICD-10-CM | POA: Diagnosis not present

## 2019-12-21 DIAGNOSIS — K219 Gastro-esophageal reflux disease without esophagitis: Secondary | ICD-10-CM | POA: Diagnosis not present

## 2019-12-27 DIAGNOSIS — R21 Rash and other nonspecific skin eruption: Secondary | ICD-10-CM | POA: Diagnosis not present

## 2019-12-27 DIAGNOSIS — D510 Vitamin B12 deficiency anemia due to intrinsic factor deficiency: Secondary | ICD-10-CM | POA: Diagnosis not present

## 2019-12-27 DIAGNOSIS — R5383 Other fatigue: Secondary | ICD-10-CM | POA: Diagnosis not present

## 2020-01-27 DIAGNOSIS — D510 Vitamin B12 deficiency anemia due to intrinsic factor deficiency: Secondary | ICD-10-CM | POA: Diagnosis not present

## 2020-03-01 DIAGNOSIS — D510 Vitamin B12 deficiency anemia due to intrinsic factor deficiency: Secondary | ICD-10-CM | POA: Diagnosis not present

## 2020-04-03 DIAGNOSIS — D510 Vitamin B12 deficiency anemia due to intrinsic factor deficiency: Secondary | ICD-10-CM | POA: Diagnosis not present

## 2020-04-04 DIAGNOSIS — R140 Abdominal distension (gaseous): Secondary | ICD-10-CM | POA: Diagnosis not present

## 2020-04-04 DIAGNOSIS — K219 Gastro-esophageal reflux disease without esophagitis: Secondary | ICD-10-CM | POA: Diagnosis not present

## 2020-05-09 DIAGNOSIS — E782 Mixed hyperlipidemia: Secondary | ICD-10-CM | POA: Diagnosis not present

## 2020-05-09 DIAGNOSIS — D510 Vitamin B12 deficiency anemia due to intrinsic factor deficiency: Secondary | ICD-10-CM | POA: Diagnosis not present

## 2020-05-16 ENCOUNTER — Other Ambulatory Visit: Payer: Self-pay | Admitting: Internal Medicine

## 2020-05-16 DIAGNOSIS — R569 Unspecified convulsions: Secondary | ICD-10-CM | POA: Diagnosis not present

## 2020-05-16 DIAGNOSIS — Z0000 Encounter for general adult medical examination without abnormal findings: Secondary | ICD-10-CM | POA: Diagnosis not present

## 2020-05-16 DIAGNOSIS — Z1231 Encounter for screening mammogram for malignant neoplasm of breast: Secondary | ICD-10-CM

## 2020-05-16 DIAGNOSIS — F32A Depression, unspecified: Secondary | ICD-10-CM | POA: Diagnosis not present

## 2020-05-23 ENCOUNTER — Other Ambulatory Visit: Payer: Self-pay

## 2020-05-23 ENCOUNTER — Ambulatory Visit
Admission: RE | Admit: 2020-05-23 | Discharge: 2020-05-23 | Disposition: A | Discharge status: Home / Self Care | Payer: Medicare HMO | Source: Ambulatory Visit | Attending: Internal Medicine | Admitting: Internal Medicine

## 2020-05-23 DIAGNOSIS — Z1231 Encounter for screening mammogram for malignant neoplasm of breast: Secondary | ICD-10-CM | POA: Insufficient documentation

## 2020-05-29 DIAGNOSIS — R1013 Epigastric pain: Secondary | ICD-10-CM | POA: Diagnosis not present

## 2020-05-29 DIAGNOSIS — K921 Melena: Secondary | ICD-10-CM | POA: Diagnosis not present

## 2020-05-29 DIAGNOSIS — K580 Irritable bowel syndrome with diarrhea: Secondary | ICD-10-CM | POA: Diagnosis not present

## 2020-06-01 ENCOUNTER — Other Ambulatory Visit: Admission: RE | Admit: 2020-06-01 | Payer: Medicare HMO | Source: Ambulatory Visit

## 2020-06-02 ENCOUNTER — Encounter: Payer: Self-pay | Admitting: *Deleted

## 2020-06-05 ENCOUNTER — Other Ambulatory Visit: Payer: Self-pay

## 2020-06-05 ENCOUNTER — Ambulatory Visit: Payer: Medicare HMO | Admitting: Anesthesiology

## 2020-06-05 ENCOUNTER — Encounter
Admission: RE | Disposition: A | Discharge status: Home / Self Care | Payer: Self-pay | Source: Home / Self Care | Attending: Gastroenterology

## 2020-06-05 ENCOUNTER — Encounter: Payer: Self-pay | Admitting: *Deleted

## 2020-06-05 ENCOUNTER — Ambulatory Visit
Admission: RE | Admit: 2020-06-05 | Discharge: 2020-06-05 | Disposition: A | Discharge status: Home / Self Care | Payer: Medicare HMO | Attending: Gastroenterology | Admitting: Gastroenterology

## 2020-06-05 DIAGNOSIS — Z86010 Personal history of colonic polyps: Secondary | ICD-10-CM | POA: Diagnosis not present

## 2020-06-05 DIAGNOSIS — Z9884 Bariatric surgery status: Secondary | ICD-10-CM | POA: Insufficient documentation

## 2020-06-05 DIAGNOSIS — Z980 Intestinal bypass and anastomosis status: Secondary | ICD-10-CM | POA: Diagnosis not present

## 2020-06-05 DIAGNOSIS — Z886 Allergy status to analgesic agent status: Secondary | ICD-10-CM | POA: Insufficient documentation

## 2020-06-05 DIAGNOSIS — Z79899 Other long term (current) drug therapy: Secondary | ICD-10-CM | POA: Insufficient documentation

## 2020-06-05 DIAGNOSIS — K219 Gastro-esophageal reflux disease without esophagitis: Secondary | ICD-10-CM | POA: Insufficient documentation

## 2020-06-05 DIAGNOSIS — K3189 Other diseases of stomach and duodenum: Secondary | ICD-10-CM | POA: Diagnosis not present

## 2020-06-05 DIAGNOSIS — K921 Melena: Secondary | ICD-10-CM | POA: Insufficient documentation

## 2020-06-05 DIAGNOSIS — K2970 Gastritis, unspecified, without bleeding: Secondary | ICD-10-CM | POA: Diagnosis not present

## 2020-06-05 DIAGNOSIS — Z888 Allergy status to other drugs, medicaments and biological substances status: Secondary | ICD-10-CM | POA: Diagnosis not present

## 2020-06-05 HISTORY — DX: Depression, unspecified: F32.A

## 2020-06-05 HISTORY — DX: Anxiety disorder, unspecified: F41.9

## 2020-06-05 HISTORY — DX: Gastro-esophageal reflux disease without esophagitis: K21.9

## 2020-06-05 HISTORY — PX: ESOPHAGOGASTRODUODENOSCOPY (EGD) WITH PROPOFOL: SHX5813

## 2020-06-05 SURGERY — ESOPHAGOGASTRODUODENOSCOPY (EGD) WITH PROPOFOL
Anesthesia: General

## 2020-06-05 MED ORDER — PROPOFOL 500 MG/50ML IV EMUL
INTRAVENOUS | Status: AC
  Filled 2020-06-05: qty 50

## 2020-06-05 MED ORDER — PROPOFOL 500 MG/50ML IV EMUL
INTRAVENOUS | Status: DC | PRN
  Administered 2020-06-05: 120 ug/kg/min via INTRAVENOUS

## 2020-06-05 MED ORDER — SODIUM CHLORIDE 0.9 % IV SOLN: INTRAVENOUS | Status: DC

## 2020-06-05 MED ORDER — LIDOCAINE HCL (PF) 2 % IJ SOLN
INTRAMUSCULAR | Status: AC
  Filled 2020-06-05: qty 2

## 2020-06-05 MED ORDER — LIDOCAINE HCL (CARDIAC) PF 100 MG/5ML IV SOSY
PREFILLED_SYRINGE | INTRAVENOUS | Status: DC | PRN
  Administered 2020-06-05: 40 mg via INTRAVENOUS

## 2020-06-05 NOTE — Interval H&P Note (Signed)
History and Physical Interval Note:  06/05/2020 1:20 PM  Donna Richardson  has presented today for surgery, with the diagnosis of GERD.  The various methods of treatment have been discussed with the patient and family. After consideration of risks, benefits and other options for treatment, the patient has consented to  Procedure(s): ESOPHAGOGASTRODUODENOSCOPY (EGD) WITH PROPOFOL (N/A) as a surgical intervention.  The patient's history has been reviewed, patient examined, no change in status, stable for surgery.  I have reviewed the patient's chart and labs.  Questions were answered to the patient's satisfaction.     Lesly Rubenstein  Ok to proceed with EGD

## 2020-06-05 NOTE — Op Note (Addendum)
[facility] Gastroenterology Patient Name: Donna Richardson Procedure Date: 06/05/2020 1:23 PM MRN: [id number] Account #: [id number] Date of Birth: Apr 06, 1945 Admit Type: Outpatient Age: 75 Room: [facility] ENDO ROOM 3 Gender: Female Note Status: Supervisor Override Procedure:             Upper GI endoscopy Indications:           Gastro-esophageal reflux disease, Melena Providers:             Andrey Farmer MD, MD Medicines:             Monitored Anesthesia Care Complications:         No immediate complications. Estimated blood loss:                         Minimal. Procedure:             Pre-Anesthesia Assessment:                        - Prior to the procedure, a History and Physical was                         performed, and patient medications and allergies were                         reviewed. The patient is competent. The risks and                         benefits of the procedure and the sedation options and                         risks were discussed with the patient. All questions                         were answered and informed consent was obtained.                         Patient identification and proposed procedure were                         verified by the physician, the nurse, the anesthetist                         and the technician in the endoscopy suite. Mental                         Status Examination: alert and oriented. Airway                         Examination: normal oropharyngeal airway and neck                         mobility. Respiratory Examination: clear to                         auscultation. CV Examination: normal. Prophylactic                         Antibiotics: The patient does not require prophylactic  antibiotics. Prior Anticoagulants: The patient has                         taken no previous anticoagulant or antiplatelet                         agents. ASA Grade Assessment: II - A patient with mild                          systemic disease. After reviewing the risks and                         benefits, the patient was deemed in satisfactory                         condition to undergo the procedure. The anesthesia                         plan was to use monitored anesthesia care (MAC).                         Immediately prior to administration of medications,                         the patient was re-assessed for adequacy to receive                         sedatives. The heart rate, respiratory rate, oxygen                         saturations, blood pressure, adequacy of pulmonary                         ventilation, and response to care were monitored                         throughout the procedure. The physical status of the                         patient was re-assessed after the procedure.                        After obtaining informed consent, the endoscope was                         passed under direct vision. Throughout the procedure,                         the patient's blood pressure, pulse, and oxygen                         saturations were monitored continuously. The Endoscope                         was introduced through the mouth, and advanced to the                         second part of duodenum. The upper GI endoscopy was  accomplished without difficulty. The patient tolerated                         the procedure well. Findings:      The examined esophagus was normal.      Evidence of a previous surgical anastomosis was found in the stomach.       This was characterized by healthy appearing mucosa. Biopsies were taken       with a cold forceps for Helicobacter pylori testing. Estimated blood       loss was minimal.      The examined what was presumed jejunum was normal. Will need to review       previous gastric surgery to determine true anatomy Impression:            - Normal esophagus.                        - A previous surgical  anastomosis was found,                         characterized by healthy appearing mucosa. Biopsied.                        - Normal examined jejunum. Recommendation:        - Discharge patient to home.                        - Resume previous diet.                        - Continue present medications.                        - Await pathology results.                        - Return to referring physician as previously                         scheduled. Procedure Code(s):     --- Professional ---                        [phone], Esophagogastroduodenoscopy, flexible,                         transoral; with biopsy, single or multiple Diagnosis Code(s):     --- Professional ---                        Z98.0, Intestinal bypass and anastomosis status                        K92.1, Melena (includes Hematochezia) CPT copyright 2019 American Medical Association. All rights reserved. The codes documented in this report are preliminary and upon coder review may  be revised to meet current compliance requirements. Andrey Farmer MD, MD 06/05/2020 1:37:13 PM Number of Addenda: 0 Note Initiated On: 06/05/2020 1:23 PM Estimated Blood Loss:  Estimated blood loss was minimal. Estimated blood loss                         was minimal.  [facility]

## 2020-06-05 NOTE — Anesthesia Preprocedure Evaluation (Signed)
Anesthesia Evaluation  Patient identified by MRN, date of birth, ID band Patient awake    Reviewed: Allergy & Precautions, NPO status , Patient's Chart, lab work & pertinent test results  History of Anesthesia Complications Negative for: history of anesthetic complications  Airway Mallampati: III  TM Distance: >3 FB Neck ROM: Full    Dental  (+) Lower Dentures, Upper Dentures   Pulmonary neg sleep apnea, neg COPD, Not current smoker,    Pulmonary exam normal        Cardiovascular (-) hypertension(-) Past MI and (-) CHF Normal cardiovascular exam(-) dysrhythmias (-) Valvular Problems/Murmurs     Neuro/Psych Seizures - (last 9 years ago), Well Controlled,  PSYCHIATRIC DISORDERS Anxiety Depression    GI/Hepatic Neg liver ROS, PUD, GERD  Medicated and Controlled,  Endo/Other  neg diabetes  Renal/GU negative Renal ROS     Musculoskeletal   Abdominal   Peds  Hematology  (+) anemia ,   Anesthesia Other Findings   Reproductive/Obstetrics                             Anesthesia Physical  Anesthesia Plan  ASA: II  Anesthesia Plan: General   Post-op Pain Management:    Induction: Intravenous  PONV Risk Score and Plan: 3 and TIVA and Propofol infusion  Airway Management Planned: Nasal Cannula and Natural Airway  Additional Equipment:   Intra-op Plan:   Post-operative Plan:   Informed Consent: I have reviewed the patients History and Physical, chart, labs and discussed the procedure including the risks, benefits and alternatives for the proposed anesthesia with the patient or authorized representative who has indicated his/her understanding and acceptance.       Plan Discussed with: CRNA, Anesthesiologist and Surgeon  Anesthesia Plan Comments:         Anesthesia Quick Evaluation

## 2020-06-05 NOTE — Transfer of Care (Signed)
Immediate Anesthesia Transfer of Care Note  Patient: Donna Richardson  Procedure(s) Performed: ESOPHAGOGASTRODUODENOSCOPY (EGD) WITH PROPOFOL (N/A )  Patient Location: PACU  Anesthesia Type:General  Level of Consciousness: awake and sedated  Airway & Oxygen Therapy: Patient Spontanous Breathing, Patient connected to nasal cannula oxygen and Patient connected to face mask oxygen  Post-op Assessment: Report given to RN and Post -op Vital signs reviewed and stable  Post vital signs: Reviewed and stable  Last Vitals:  Vitals Value Taken Time  BP    Temp    Pulse    Resp    SpO2      Last Pain:  Vitals:   06/05/20 1259  TempSrc: Temporal  PainSc: 0-No pain         Complications: No complications documented.

## 2020-06-05 NOTE — Anesthesia Postprocedure Evaluation (Signed)
Anesthesia Post Note  Patient: Donna Richardson  Procedure(s) Performed: ESOPHAGOGASTRODUODENOSCOPY (EGD) WITH PROPOFOL (N/A )  Patient location during evaluation: Phase II Anesthesia Type: General Level of consciousness: awake and alert, awake and oriented Pain management: pain level controlled Vital Signs Assessment: post-procedure vital signs reviewed and stable Respiratory status: spontaneous breathing, nonlabored ventilation and respiratory function stable Cardiovascular status: blood pressure returned to baseline and stable Postop Assessment: no apparent nausea or vomiting Anesthetic complications: no   No complications documented.   Last Vitals:  Vitals:   06/05/20 1346 06/05/20 1406  BP: 128/74 133/64  Pulse:    Resp:    Temp:    SpO2:      Last Pain:  Vitals:   06/05/20 1356  TempSrc:   PainSc: 0-No pain                 Phill Mutter

## 2020-06-05 NOTE — H&P (Signed)
Outpatient short stay form Pre-procedure 06/05/2020 1:17 PM Raylene Miyamoto MD, MPH  Primary Physician: Dr. Sabra Heck  Reason for visit:  [place]  History of present illness:   75 y/o with history of billroth II with history of dark stool one week after her brother passed away. She takes protonix BID. No family history of GI malignancies. No blood thinners.    Current Facility-Administered Medications:  .  0.9 %  sodium chloride infusion, , Intravenous, Continuous, Lisandra Mathisen, Hilton Cork, MD  Medications Prior to Admission  Medication Sig Dispense Refill Last Dose  . amitriptyline (ELAVIL) 25 MG tablet Take 25 mg by mouth at bedtime.   06/04/2020 at Unknown time  . Ascorbic Acid (VITAMIN C) 1000 MG tablet Take 1,000 mg by mouth daily.   06/04/2020 at Unknown time  . Calcium Carbonate-Vitamin D (CALTRATE 600+D PO) Take by mouth 2 (two) times daily.   06/04/2020 at Unknown time  . Cyanocobalamin (VITAMIN B-12) 2500 MCG SUBL Inject 1,000 mcg into the muscle every 30 (thirty) days.   Past Month at Unknown time  . cyclobenzaprine (FLEXERIL) 10 MG tablet Take 10 mg by mouth daily.   06/04/2020 at Unknown time  . fluticasone (FLONASE) 50 MCG/ACT nasal spray Place into both nostrils daily.   06/04/2020 at Unknown time  . Multiple Vitamin (MULTIVITAMIN) tablet Take 1 tablet by mouth daily.   06/04/2020 at Unknown time  . pantoprazole (PROTONIX) 40 MG tablet Take 40 mg by mouth 2 (two) times daily.   06/04/2020 at Unknown time  . potassium chloride SA (K-DUR,KLOR-CON) 20 MEQ tablet Take 20 mEq by mouth daily.   06/04/2020 at Unknown time  . promethazine (PHENERGAN) 12.5 MG tablet Take 12.5 mg by mouth. As needed     . propranolol (INDERAL) 40 MG tablet Take 40 mg by mouth daily.   06/04/2020 at Unknown time  . sertraline (ZOLOFT) 50 MG tablet Take 50 mg by mouth daily.   06/04/2020 at Unknown time  . temazepam (RESTORIL) 30 MG capsule Take 30 mg by mouth at bedtime.   06/04/2020 at Unknown time  . vitamin E 1000 UNIT capsule  Take 1,000 Units by mouth daily.   06/04/2020 at Unknown time  . melatonin 5 MG TABS Take 5 mg by mouth at bedtime as needed.     . ondansetron (ZOFRAN) 4 MG tablet Take 4 mg by mouth every 8 (eight) hours as needed for nausea or vomiting.        Allergies  Allergen Reactions  . Aspirin   . Celexa [Citalopram Hydrobromide]   . Ondansetron Other (See Comments)  . Zoloft [Sertraline Hcl]   . Lexapro [Escitalopram Oxalate] Rash    rash  . Nortriptyline Hcl Rash    rash  . Rifaximin Diarrhea and Other (See Comments)    Headache     Past Medical History:  Diagnosis Date  . Anemia   . Anxiety   . Colon adenomas   . Depression   . Gastric ulcer   . GERD (gastroesophageal reflux disease)   . Hyperlipidemia   . Hyperlipidemia   . Meningioma ([place])   . Seizures ([place])     Review of systems:  Otherwise negative.    Physical Exam  Gen: Alert, oriented. Appears stated age.  HEENT: PERRLA. Lungs: No respiratory distress CV: RRR Abd: soft, benign, no masses Ext: No edema    Planned procedures: Proceed with EGD. The patient understands the nature of the planned procedure, indications, risks, alternatives and potential complications including  but not limited to bleeding, infection, perforation, damage to internal organs and possible oversedation/side effects from anesthesia. The patient agrees and gives consent to proceed.  Please refer to procedure notes for findings, recommendations and patient disposition/instructions.     Raylene Miyamoto MD, MPH. Gastroenterology 06/05/2020  1:17 PM

## 2020-06-05 NOTE — Anesthesia Procedure Notes (Signed)
Performed by: Cook-Martin, Lendy Dittrich Pre-anesthesia Checklist: Patient identified, Emergency Drugs available, Suction available, Patient being monitored and Timeout performed Patient Re-evaluated:Patient Re-evaluated prior to induction Oxygen Delivery Method: Nasal cannula Preoxygenation: Pre-oxygenation with 100% oxygen Induction Type: IV induction Airway Equipment and Method: Bite block Placement Confirmation: positive ETCO2 and CO2 detector       

## 2020-06-06 ENCOUNTER — Encounter: Payer: Self-pay | Admitting: Gastroenterology

## 2020-06-06 LAB — SURGICAL PATHOLOGY

## 2020-06-09 DIAGNOSIS — D510 Vitamin B12 deficiency anemia due to intrinsic factor deficiency: Secondary | ICD-10-CM | POA: Diagnosis not present

## 2020-06-09 DIAGNOSIS — R21 Rash and other nonspecific skin eruption: Secondary | ICD-10-CM | POA: Diagnosis not present

## 2020-07-10 DIAGNOSIS — D510 Vitamin B12 deficiency anemia due to intrinsic factor deficiency: Secondary | ICD-10-CM | POA: Diagnosis not present

## 2020-07-28 DIAGNOSIS — H26491 Other secondary cataract, right eye: Secondary | ICD-10-CM | POA: Diagnosis not present

## 2020-07-28 DIAGNOSIS — Z0100 Encounter for examination of eyes and vision without abnormal findings: Secondary | ICD-10-CM | POA: Diagnosis not present

## 2020-08-10 DIAGNOSIS — D510 Vitamin B12 deficiency anemia due to intrinsic factor deficiency: Secondary | ICD-10-CM | POA: Diagnosis not present

## 2020-09-11 DIAGNOSIS — D510 Vitamin B12 deficiency anemia due to intrinsic factor deficiency: Secondary | ICD-10-CM | POA: Diagnosis not present

## 2020-10-12 DIAGNOSIS — D510 Vitamin B12 deficiency anemia due to intrinsic factor deficiency: Secondary | ICD-10-CM | POA: Diagnosis not present

## 2020-10-12 DIAGNOSIS — E538 Deficiency of other specified B group vitamins: Secondary | ICD-10-CM | POA: Diagnosis not present

## 2020-10-12 DIAGNOSIS — M79641 Pain in right hand: Secondary | ICD-10-CM | POA: Diagnosis not present

## 2020-11-09 DIAGNOSIS — D510 Vitamin B12 deficiency anemia due to intrinsic factor deficiency: Secondary | ICD-10-CM | POA: Diagnosis not present

## 2020-11-09 DIAGNOSIS — E782 Mixed hyperlipidemia: Secondary | ICD-10-CM | POA: Diagnosis not present

## 2020-11-16 ENCOUNTER — Emergency Department: Payer: Medicare HMO

## 2020-11-16 ENCOUNTER — Other Ambulatory Visit: Payer: Self-pay

## 2020-11-16 ENCOUNTER — Emergency Department
Admission: EM | Admit: 2020-11-16 | Discharge: 2020-11-16 | Disposition: A | Discharge status: Home / Self Care | Payer: Medicare HMO | Attending: Emergency Medicine | Admitting: Emergency Medicine

## 2020-11-16 DIAGNOSIS — S79911A Unspecified injury of right hip, initial encounter: Secondary | ICD-10-CM | POA: Diagnosis not present

## 2020-11-16 DIAGNOSIS — S82141A Displaced bicondylar fracture of right tibia, initial encounter for closed fracture: Secondary | ICD-10-CM

## 2020-11-16 DIAGNOSIS — W010XXA Fall on same level from slipping, tripping and stumbling without subsequent striking against object, initial encounter: Secondary | ICD-10-CM | POA: Insufficient documentation

## 2020-11-16 DIAGNOSIS — S82101A Unspecified fracture of upper end of right tibia, initial encounter for closed fracture: Secondary | ICD-10-CM | POA: Diagnosis not present

## 2020-11-16 DIAGNOSIS — S8991XA Unspecified injury of right lower leg, initial encounter: Secondary | ICD-10-CM | POA: Diagnosis present

## 2020-11-16 DIAGNOSIS — S82121A Displaced fracture of lateral condyle of right tibia, initial encounter for closed fracture: Secondary | ICD-10-CM | POA: Diagnosis not present

## 2020-11-16 DIAGNOSIS — Z043 Encounter for examination and observation following other accident: Secondary | ICD-10-CM | POA: Diagnosis not present

## 2020-11-16 MED ORDER — OXYCODONE-ACETAMINOPHEN 5-325 MG PO TABS
1.0000 | ORAL_TABLET | Freq: Once | ORAL | Status: AC
Start: 2020-11-16 — End: 2020-11-16
  Administered 2020-11-16: 1 via ORAL
  Filled 2020-11-16: qty 1

## 2020-11-16 MED ORDER — OXYCODONE HCL 5 MG PO TABS: 5.0000 mg | ORAL_TABLET | Freq: Three times a day (TID) | ORAL | 0 refills | Status: AC | PRN

## 2020-11-16 MED ORDER — OXYCODONE-ACETAMINOPHEN 5-325 MG PO TABS: 1.0000 | ORAL_TABLET | ORAL | 0 refills | Status: DC | PRN

## 2020-11-16 NOTE — ED Triage Notes (Signed)
Pt comes with c/o right leg injury. Pt states she tripped over her dog last night. Pt denies any LOC or head trauma. Pt not on thinners

## 2020-11-16 NOTE — ED Provider Notes (Signed)
HPI: Pt is a 75 y.o. female who presents with complaints of fall   The patient p/w  fall did not hit head R leg pain.   ROS: Denies fever, chest pain, vomiting  Past Medical History:  Diagnosis Date   Anemia    Anxiety    Colon adenomas    Depression    Gastric ulcer    GERD (gastroesophageal reflux disease)    Hyperlipidemia    Hyperlipidemia    Meningioma (HCC)    Seizures (HCC)    Vitals:   11/16/20 1012  BP: 104/60  Pulse: 80  Resp: 17  Temp: 98.5 F (36.9 C)  SpO2: 100%    Focused Physical Exam: Gen: No acute distress Head: atraumatic, normocephalic Eyes: Extraocular movements grossly intact; conjunctiva clear CV: RRR Lung: No increased WOB, no stridor GI: ND, no obvious masses Neuro: Alert and awake Tender mostly R knee but difficult to tell where else she is tender   Medical Decision Making and Plan: Given the patient's initial medical screening exam, the following diagnostic evaluation has been ordered. The patient will be placed in the appropriate treatment space, once one is available, to complete the evaluation and treatment. I have discussed the plan of care with the patient and I have advised the patient that an ED physician or mid-level practitioner will reevaluate their condition after the test results have been received, as the results may give them additional insight into the type of treatment they may need.   Diagnostics: xrays   Treatments: none immediately   Vanessa Bloomingdale, MD 11/16/20 1016

## 2020-11-16 NOTE — ED Provider Notes (Signed)
[facility] Emergency Department Provider Note   ____________________________________________   Event Date/Time   First MD Initiated Contact with Patient 11/16/20 1226     (approximate)  I have reviewed the triage vital signs and the nursing notes.   HISTORY  Chief Complaint Leg Injury    HPI Donna Richardson is a 75 y.o. female with past medical history of hyperlipidemia, seizures, and peptic ulcer disease who presents to the ED complaining of leg pain.  Patient reports that last night she got tripped up by her dog and fell onto her right leg.  She denies hitting her head or losing consciousness.  She has been dealing with pain primarily in her right knee since the fall, describes pain as sharp and exacerbated when she bears weight on the right leg.  She states the pain will shoot down her right leg into her ankle, denies any right hip pain or pain to her left lower extremity.  She has never had an injury to her right knee before, has not taken anything for pain at home.        Past Medical History:  Diagnosis Date   Anemia    Anxiety    Colon adenomas    Depression    Gastric ulcer    GERD (gastroesophageal reflux disease)    Hyperlipidemia    Hyperlipidemia    Meningioma ([employer])    Seizures ([place])     There are no problems to display for this patient.   Past Surgical History:  Procedure Laterality Date   CHOLECYSTECTOMY     COLONOSCOPY WITH PROPOFOL     COLONOSCOPY WITH PROPOFOL N/A 03/24/2017   Procedure: COLONOSCOPY WITH PROPOFOL;  Surgeon: Manya Silvas, MD;  Location: [facility] ENDOSCOPY;  Service: Endoscopy;  Laterality: N/A;   ESOPHAGOGASTRODUODENOSCOPY     ESOPHAGOGASTRODUODENOSCOPY (EGD) WITH PROPOFOL N/A 03/24/2017   Procedure: ESOPHAGOGASTRODUODENOSCOPY (EGD) WITH PROPOFOL;  Surgeon: Manya Silvas, MD;  Location: [facility] ENDOSCOPY;  Service: Endoscopy;  Laterality: N/A;   ESOPHAGOGASTRODUODENOSCOPY (EGD) WITH PROPOFOL N/A 06/05/2020    Procedure: ESOPHAGOGASTRODUODENOSCOPY (EGD) WITH PROPOFOL;  Surgeon: Lesly Rubenstein, MD;  Location: [facility] ENDOSCOPY;  Service: Endoscopy;  Laterality: N/A;   FRACTURE SURGERY Right    wrist   FRACTURE SURGERY Left    elbow   PARTIAL GASTRECTOMY      Prior to Admission medications   Medication Sig Start Date End Date Taking? Authorizing Provider  oxyCODONE-acetaminophen (PERCOCET) 5-325 MG tablet Take 1 tablet by mouth every 4 (four) hours as needed for severe pain. 11/16/20 11/16/21 Yes Blake Divine, MD  amitriptyline (ELAVIL) 25 MG tablet Take 25 mg by mouth at bedtime.    [provider]  Ascorbic Acid (VITAMIN C) 1000 MG tablet Take 1,000 mg by mouth daily.    [provider]  Calcium Carbonate-Vitamin D (CALTRATE 600+D PO) Take by mouth 2 (two) times daily.    [provider]  Cyanocobalamin (VITAMIN B-12) 2500 MCG SUBL Inject 1,000 mcg into the muscle every 30 (thirty) days.    [provider]  cyclobenzaprine (FLEXERIL) 10 MG tablet Take 10 mg by mouth daily.    [provider]  fluticasone (FLONASE) 50 MCG/ACT nasal spray Place into both nostrils daily.    [provider]  melatonin 5 MG TABS Take 5 mg by mouth at bedtime as needed.    [provider]  Multiple Vitamin (MULTIVITAMIN) tablet Take 1 tablet by mouth daily.    [provider]  ondansetron (  ZOFRAN) 4 MG tablet Take 4 mg by mouth every 8 (eight) hours as needed for nausea or vomiting.    [provider]  pantoprazole (PROTONIX) 40 MG tablet Take 40 mg by mouth 2 (two) times daily.    [provider]  potassium chloride SA (K-DUR,KLOR-CON) 20 MEQ tablet Take 20 mEq by mouth daily.    [provider]  promethazine (PHENERGAN) 12.5 MG tablet Take 12.5 mg by mouth. As needed    [provider]  propranolol (INDERAL) 40 MG tablet Take 40 mg by mouth daily.    [provider]  sertraline (ZOLOFT) 50 MG tablet  Take 50 mg by mouth daily.    [provider]  temazepam (RESTORIL) 30 MG capsule Take 30 mg by mouth at bedtime.    [provider]  vitamin E 1000 UNIT capsule Take 1,000 Units by mouth daily.    [provider]    Allergies Aspirin, Celexa [citalopram hydrobromide], Ondansetron, Zoloft [sertraline hcl], Lexapro [escitalopram oxalate], Nortriptyline hcl, and Rifaximin  Family History  Problem Relation Age of Onset   Breast cancer Sister 65    Social History Social History   Tobacco Use   Smoking status: Never   Smokeless tobacco: Never  Vaping Use   Vaping Use: Never used  Substance Use Topics   Alcohol use: No   Drug use: No    Review of Systems  Constitutional: No fever/chills Eyes: No visual changes. ENT: No sore throat. Cardiovascular: Denies chest pain. Respiratory: Denies shortness of breath. Gastrointestinal: No abdominal pain.  No nausea, no vomiting.  No diarrhea.  No constipation. Genitourinary: Negative for dysuria. Musculoskeletal: Negative for back pain.  Positive for right knee pain. Skin: Negative for rash. Neurological: Negative for headaches, focal weakness or numbness.  ____________________________________________   PHYSICAL EXAM:  VITAL SIGNS: ED Triage Vitals  Enc Vitals Group     BP 11/16/20 1012 104/60     Pulse Rate 11/16/20 1012 80     Resp 11/16/20 1012 17     Temp 11/16/20 1012 98.5 F (36.9 C)     Temp src --      SpO2 11/16/20 1012 100 %     Weight --      Height --      Head Circumference --      Peak Flow --      Pain Score 11/16/20 1011 8     Pain Loc --      Pain Edu? --      Excl. in [place]? --     Constitutional: Alert and oriented. Eyes: Conjunctivae are normal. Head: Atraumatic. Nose: No congestion/rhinnorhea. Mouth/Throat: Mucous membranes are moist. Neck: Normal ROM Cardiovascular: Normal rate, regular rhythm. Grossly normal heart sounds.  2+ DP pulses bilaterally. Respiratory: Normal  respiratory effort.  No retractions. Lungs CTAB. Gastrointestinal: Soft and nontender. No distention. Genitourinary: deferred Musculoskeletal: Diffuse tenderness to palpation of right knee with no associated edema or obvious deformity.  No tenderness to palpation at left knee, bilateral hips, or bilateral ankles. Neurologic:  Normal speech and language. No gross focal neurologic deficits are appreciated. Skin:  Skin is warm, dry and intact. No rash noted. Psychiatric: Mood and affect are normal. Speech and behavior are normal.  ____________________________________________   LABS (all labs ordered are listed, but only abnormal results are displayed)  Labs Reviewed - No data to display   PROCEDURES  Procedure(s) performed (including Critical Care):  Procedures   ____________________________________________   INITIAL IMPRESSION /  ASSESSMENT AND PLAN / ED COURSE      75 year old female with past medical history of hyperlipidemia, seizures, peptic ulcer disease who presents to the ED complaining of trip and fall over her dog last night where she injured her right knee.  X-rays of right hip and ankle reviewed by me with no fracture or dislocation.  X-ray of right knee shows lateral tibial plateau fracture, patient neurovascularly intact to her distal right lower extremity.  We will place patient in knee immobilizer and give dose of Percocet for pain, plan to discuss with orthopedics.  Imaging reviewed with Dr. Roland Rack of orthopedics, who recommends obtaining CT scan to better evaluate location of tibial plateau fracture.  CT scan demonstrates significant involvement of posterior tibial plateau and Dr. Roland Rack recommends referral to orthopedic trauma at [facility], who he will be in touch with as well.  Patient placed in knee immobilizer and given walker, may be touch toe weightbearing per orthopedics.  She will be discharged home with short course of pain medication, patient and son counseled  to return to the ED for any new or worsening symptoms.  Patient agrees with plan.      ____________________________________________   FINAL CLINICAL IMPRESSION(S) / ED DIAGNOSES  Final diagnoses:  Closed fracture of right tibial plateau, initial encounter     ED Discharge Orders          Ordered    oxyCODONE-acetaminophen (PERCOCET) 5-325 MG tablet  Every 4 hours PRN        11/16/20 1516             Note:  This document was prepared using Dragon voice recognition software and may include unintentional dictation errors.    Blake Divine, MD 11/16/20 [phone]

## 2020-11-20 DIAGNOSIS — S83114A Anterior dislocation of proximal end of tibia, right knee, initial encounter: Secondary | ICD-10-CM | POA: Diagnosis not present

## 2020-11-20 DIAGNOSIS — S82124A Nondisplaced fracture of lateral condyle of right tibia, initial encounter for closed fracture: Secondary | ICD-10-CM | POA: Diagnosis not present

## 2020-11-29 DIAGNOSIS — S82124D Nondisplaced fracture of lateral condyle of right tibia, subsequent encounter for closed fracture with routine healing: Secondary | ICD-10-CM | POA: Diagnosis not present

## 2020-12-27 DIAGNOSIS — S82124D Nondisplaced fracture of lateral condyle of right tibia, subsequent encounter for closed fracture with routine healing: Secondary | ICD-10-CM | POA: Diagnosis not present

## 2021-01-12 DIAGNOSIS — Z0000 Encounter for general adult medical examination without abnormal findings: Secondary | ICD-10-CM | POA: Diagnosis not present

## 2021-01-12 DIAGNOSIS — D510 Vitamin B12 deficiency anemia due to intrinsic factor deficiency: Secondary | ICD-10-CM | POA: Diagnosis not present

## 2021-01-12 DIAGNOSIS — F32A Depression, unspecified: Secondary | ICD-10-CM | POA: Diagnosis not present

## 2021-01-24 DIAGNOSIS — S82124D Nondisplaced fracture of lateral condyle of right tibia, subsequent encounter for closed fracture with routine healing: Secondary | ICD-10-CM | POA: Diagnosis not present

## 2021-02-12 DIAGNOSIS — D510 Vitamin B12 deficiency anemia due to intrinsic factor deficiency: Secondary | ICD-10-CM | POA: Diagnosis not present

## 2021-03-15 DIAGNOSIS — D510 Vitamin B12 deficiency anemia due to intrinsic factor deficiency: Secondary | ICD-10-CM | POA: Diagnosis not present

## 2021-03-19 DIAGNOSIS — M810 Age-related osteoporosis without current pathological fracture: Secondary | ICD-10-CM | POA: Diagnosis not present

## 2021-04-09 ENCOUNTER — Encounter: Payer: Self-pay | Admitting: Emergency Medicine

## 2021-04-09 ENCOUNTER — Emergency Department: Payer: Medicare HMO

## 2021-04-09 ENCOUNTER — Other Ambulatory Visit: Payer: Self-pay

## 2021-04-09 ENCOUNTER — Emergency Department
Admission: EM | Admit: 2021-04-09 | Discharge: 2021-04-09 | Disposition: A | Discharge status: Home / Self Care | Payer: Medicare HMO | Attending: Emergency Medicine | Admitting: Emergency Medicine

## 2021-04-09 DIAGNOSIS — Y93K1 Activity, walking an animal: Secondary | ICD-10-CM | POA: Diagnosis not present

## 2021-04-09 DIAGNOSIS — X509XXA Other and unspecified overexertion or strenuous movements or postures, initial encounter: Secondary | ICD-10-CM | POA: Insufficient documentation

## 2021-04-09 DIAGNOSIS — S99921A Unspecified injury of right foot, initial encounter: Secondary | ICD-10-CM

## 2021-04-09 DIAGNOSIS — M7989 Other specified soft tissue disorders: Secondary | ICD-10-CM | POA: Diagnosis not present

## 2021-04-09 NOTE — ED Notes (Signed)
See triage note  presents with pain right ankle /foot  states her dog leash got caught around her ankle   good pulses  ?

## 2021-04-09 NOTE — ED Provider Notes (Signed)
? ?[facility] ?Provider Note ? ? ? Event Date/Time  ? First MD Initiated Contact with Patient 04/09/21 1756   ?  (approximate) ? ? ?History  ? ?Chief Complaint ?Foot Injury ? ? ?HPI ?Donna Richardson is a 76 y.o. female, history of anxiety, anemia, depression, gastric ulcers, seizures, menangioma, GERD, presents to the emergency department for evaluation of foot pain.  Patient states that she was walking her dogs when one of them started barking at the second 1, causing the leash to get wrapped around her ankle and get pulled.  She did not experience any fall or head injury.  Currently endorsing pain along the medial ankle.  Denies fever/chills, neck pain, knee pain, hip pain, numbness/tingling in lower extremities, or popping/clicking sensation. ? ?History Limitations: No limitations. ? ?  ? ? ?Physical Exam  ?Triage Vital Signs: ?ED Triage Vitals  ?Enc Vitals Group  ?   BP 04/09/21 1727 (!) 153/78  ?   Pulse Rate 04/09/21 1727 81  ?   Resp 04/09/21 1727 20  ?   Temp 04/09/21 1727 99 ?F (37.2 ?C)  ?   Temp Source 04/09/21 1727 Oral  ?   SpO2 04/09/21 1727 99 %  ?   Weight 04/09/21 1800 115 lb 15.4 oz (52.6 kg)  ?   Height 04/09/21 1728 5' 4.5" (1.638 m)  ?   Head Circumference --   ?   Peak Flow --   ?   Pain Score 04/09/21 1728 8  ?   Pain Loc --   ?   Pain Edu? --   ?   Excl. in [place]? --   ? ? ?Most recent vital signs: ?Vitals:  ? 04/09/21 1727 04/09/21 1950  ?BP: (!) 153/78 131/64  ?Pulse: 81 78  ?Resp: 20 18  ?Temp: 99 ?F (37.2 ?C)   ?SpO2: 99% 100%  ? ? ?General: Awake, NAD.  ?CV: Good peripheral perfusion.  ?Resp: Normal effort.  ?Abd: Soft, non-tender. No distention.  ?Neuro: At baseline. No gross neurological deficits.  ?Other: No gross deformities in the right ankle/foot.  Mild soft tissue swelling diffusely around the right foot/ankle.  Patient does endorse tenderness when palpating the medial malleolus, as well as a proximal first metatarsal.  Pulse, motor, sensation intact distally.   Normal cap refill.  Patient is able to ambulate well across the room without any assistance. ? ?Physical Exam ? ? ? ?ED Results / Procedures / Treatments  ?Labs ?(all labs ordered are listed, but only abnormal results are displayed) ?Labs Reviewed - No data to display ? ? ?EKG ?Not applicable. ? ? ?RADIOLOGY ? ?ED Provider Interpretation: I personally reviewed and interpreted this x-ray, no evidence of acute fracture. ? ?DG Ankle Complete Right ? ?Result Date: 04/09/2021 ?CLINICAL DATA:  Right ankle and foot pain, injury EXAM: RIGHT ANKLE - COMPLETE 3+ VIEW COMPARISON:  11/18/2020 FINDINGS: Frontal, oblique, lateral views of the right ankle are obtained. No acute displaced fracture, subluxation, or dislocation. Joint spaces are well preserved. There is diffuse soft tissue swelling of the right lower extremity and ankle, greatest laterally. IMPRESSION: 1. Soft tissue swelling.  No acute fracture. Electronically Signed   By: Randa Ngo M.D.   On: 04/09/2021 18:27   ? ?PROCEDURES: ? ?Critical Care performed: None. ? ?Procedures ? ? ? ?MEDICATIONS ORDERED IN ED: ?Medications - No data to display ? ? ?IMPRESSION / MDM / ASSESSMENT AND PLAN / ED COURSE  ?I reviewed the triage vital signs and the  nursing notes. ?             ?               ? ? ?Differential diagnosis includes, but is not limited to, ankle sprain, distal tibial fracture, distal fibula fracture, metatarsal fracture.  ? ?ED Course ?Patient appears well.  Vital signs within normal limits.  NAD.  Currently afebrile. ? ?Ankle x-ray shows evidence of soft tissue swelling, otherwise no acute fractures. ? ?Assessment/Plan ?Given the patient's history, physical exam, and work-up, I do not suspect any serious or life-threatening injuries.  Presentation consistent with ankle sprain versus benign ankle/metatarsal contusion.  X-ray reassuring for no evidence of fractures.  Patient is able to ambulate well.  Will provide patient with Ace bandage for comfort.   Encouraged her to take Tylenol/ibuprofen as needed for pain.  We will provide her with a referral to orthopedics to follow-up with if her symptoms do not improve after a week. ? ?Patient was provided with anticipatory guidance, return precautions, and educational material. Encouraged the patient to return to the emergency department at any time if they begin to experience any new or worsening symptoms.  ? ?  ? ? ?FINAL CLINICAL IMPRESSION(S) / ED DIAGNOSES  ? ?Final diagnoses:  ?Injury of right foot, initial encounter  ? ? ? ?Rx / DC Orders  ? ?ED Discharge Orders   ? ? None  ? ?  ? ? ? ?Note:  This document was prepared using Dragon voice recognition software and may include unintentional dictation errors. ?  ?Teodoro Spray, [place] ?04/09/21 2214 ? ?  ?Delman Kitten, MD ?04/09/21 2351 ? ?

## 2021-04-09 NOTE — ED Triage Notes (Signed)
Pt to ED via POV with c/o right ankle/foot pain. Pt states that she was walking her dogs and the one started barking at the 2nd one. The second dogs leash got wrapped around her ankle and she feel hurting her foot/ankle.  ?

## 2021-04-09 NOTE — Discharge Instructions (Addendum)
-  Treat pain with Tylenol/ibuprofen as needed.  Rest the affected foot, ice for the first 48 hours, gradually return to normal activities as tolerated. ?-Follow-up with the orthopedist listed above if your symptoms fail to improve ?-Return to the emergency department anytime if you begin to experience any new or worsening symptoms ?

## 2021-04-16 DIAGNOSIS — D510 Vitamin B12 deficiency anemia due to intrinsic factor deficiency: Secondary | ICD-10-CM | POA: Diagnosis not present

## 2021-04-16 DIAGNOSIS — G40909 Epilepsy, unspecified, not intractable, without status epilepticus: Secondary | ICD-10-CM | POA: Diagnosis not present

## 2021-04-16 DIAGNOSIS — F32A Depression, unspecified: Secondary | ICD-10-CM | POA: Diagnosis not present

## 2021-04-16 DIAGNOSIS — M79671 Pain in right foot: Secondary | ICD-10-CM | POA: Diagnosis not present

## 2021-05-17 DIAGNOSIS — D510 Vitamin B12 deficiency anemia due to intrinsic factor deficiency: Secondary | ICD-10-CM | POA: Diagnosis not present

## 2021-06-18 DIAGNOSIS — D510 Vitamin B12 deficiency anemia due to intrinsic factor deficiency: Secondary | ICD-10-CM | POA: Diagnosis not present

## 2021-07-06 DIAGNOSIS — M818 Other osteoporosis without current pathological fracture: Secondary | ICD-10-CM | POA: Diagnosis not present

## 2021-07-06 DIAGNOSIS — D510 Vitamin B12 deficiency anemia due to intrinsic factor deficiency: Secondary | ICD-10-CM | POA: Diagnosis not present

## 2021-07-06 DIAGNOSIS — E782 Mixed hyperlipidemia: Secondary | ICD-10-CM | POA: Diagnosis not present

## 2021-07-07 ENCOUNTER — Other Ambulatory Visit: Payer: Self-pay

## 2021-07-07 ENCOUNTER — Emergency Department
Admission: EM | Admit: 2021-07-07 | Discharge: 2021-07-07 | Disposition: A | Discharge status: Home / Self Care | Payer: Medicare HMO | Attending: Emergency Medicine | Admitting: Emergency Medicine

## 2021-07-07 ENCOUNTER — Encounter: Payer: Self-pay | Admitting: Emergency Medicine

## 2021-07-07 ENCOUNTER — Emergency Department: Payer: Medicare HMO

## 2021-07-07 DIAGNOSIS — Z872 Personal history of diseases of the skin and subcutaneous tissue: Secondary | ICD-10-CM | POA: Diagnosis not present

## 2021-07-07 DIAGNOSIS — R220 Localized swelling, mass and lump, head: Secondary | ICD-10-CM | POA: Diagnosis not present

## 2021-07-07 DIAGNOSIS — J019 Acute sinusitis, unspecified: Secondary | ICD-10-CM | POA: Diagnosis not present

## 2021-07-07 DIAGNOSIS — J0100 Acute maxillary sinusitis, unspecified: Secondary | ICD-10-CM | POA: Diagnosis not present

## 2021-07-07 DIAGNOSIS — R200 Anesthesia of skin: Secondary | ICD-10-CM | POA: Diagnosis present

## 2021-07-07 LAB — BASIC METABOLIC PANEL
Anion gap: 7 (ref 5–15)
BUN: 11 mg/dL (ref 8–23)
CO2: 28 mmol/L (ref 22–32)
Calcium: 9.1 mg/dL (ref 8.9–10.3)
Chloride: 102 mmol/L (ref 98–111)
Creatinine, Ser: 0.89 mg/dL (ref 0.44–1.00)
GFR, Estimated: >60 mL/min (ref >60)
Glucose, Bld: 121 mg/dL — ABNORMAL HIGH (ref 70–99)
Potassium: 3.9 mmol/L (ref 3.5–5.1)
Sodium: 137 mmol/L (ref 135–145)

## 2021-07-07 LAB — CBC
HCT: 36.9 % (ref 36.0–46.0)
Hemoglobin: 12 g/dL (ref 12.0–15.0)
MCH: 29.6 pg (ref 26.0–34.0)
MCHC: 32.5 g/dL (ref 30.0–36.0)
MCV: 91.1 fL (ref 80.0–100.0)
Platelets: 297 10*3/uL (ref 150–400)
RBC: 4.05 MIL/uL (ref 3.87–5.11)
RDW: 14 % (ref 11.5–15.5)
WBC: 15.3 10*3/uL — ABNORMAL HIGH (ref 4.0–10.5)
nRBC: 0 % (ref 0.0–0.2)

## 2021-07-07 MED ORDER — AMOXICILLIN-POT CLAVULANATE 875-125 MG PO TABS: 1.0000 | ORAL_TABLET | Freq: Two times a day (BID) | ORAL | 0 refills | Status: AC

## 2021-07-07 MED ORDER — IOHEXOL 300 MG/ML  SOLN
75.0000 mL | Freq: Once | INTRAMUSCULAR | Status: AC | PRN
  Administered 2021-07-07: 75 mL via INTRAVENOUS

## 2021-07-07 NOTE — ED Triage Notes (Signed)
Pt via POV from home. Pt c/o R sided facial swelling and pain states it started in her eye and now it feel it is in her upper jaw. Pt also endorse cough and congestion for about 2 weeks. WBC slightly elevated in blood work done yesterday at [facility]. Denies any dental issues. Pt is A&Ox4 and NAD

## 2021-07-07 NOTE — ED Provider Notes (Signed)
[facility] Provider Note    Event Date/Time   First MD Initiated Contact with Patient 07/07/21 [phone]     (approximate)   History   Facial Swelling   HPI  Donna Richardson is a 76 y.o. female who presents with complaints of right-sided facial swelling.  Patient reports she has been having problems with this her sinuses over the last several weeks.  Yesterday morning she noticed swelling to her right eye/right cheek.  She reports it is painful.  Denies fevers     Physical Exam   Triage Vital Signs: ED Triage Vitals  Enc Vitals Group     BP 07/07/21 0754 140/65     Pulse Rate 07/07/21 0754 (!) 102     Resp 07/07/21 0754 16     Temp 07/07/21 0754 98.8 F (37.1 C)     Temp Source 07/07/21 0754 Oral     SpO2 07/07/21 0754 96 %     Weight 07/07/21 0752 53.1 kg (117 lb)     Height 07/07/21 0752 1.651 m ('5\' 5"'$ )     Head Circumference --      Peak Flow --      Pain Score 07/07/21 0752 7     Pain Loc --      Pain Edu? --      Excl. in [place]? --     Most recent vital signs: Vitals:   07/07/21 0754  BP: 140/65  Pulse: (!) 102  Resp: 16  Temp: 98.8 F (37.1 C)  SpO2: 96%     General: Awake, no distress.  CV:  Good peripheral perfusion.  Resp:  Normal effort.  Abd:  No distention.  Other:  Right face: Erythema underneath the right eye, minimal swelling noted.  No tenderness to palpation of the gums/oral exam.   ED Results / Procedures / Treatments   Labs (all labs ordered are listed, but only abnormal results are displayed) Labs Reviewed  CBC - Abnormal; Notable for the following components:      Result Value   WBC 15.3 (*)    All other components within normal limits  BASIC METABOLIC PANEL - Abnormal; Notable for the following components:   Glucose, Bld 121 (*)    All other components within normal limits     EKG     RADIOLOGY CT maxillofacial reviewed by me, evidence of right-sided sinusitis, pending radiology  review    PROCEDURES:  Critical Care performed:   Procedures   MEDICATIONS ORDERED IN ED: Medications  iohexol (OMNIPAQUE) 300 MG/ML solution 75 mL (75 mLs Intravenous Contrast Given 07/07/21 0957)     IMPRESSION / MDM / [place] / ED COURSE  I reviewed the triage vital signs and the nursing notes. Patient's presentation is most consistent with acute illness / injury with system symptoms.  Patient presents with right-sided facial pain, swelling, redness.  Differential includes sinusitis, preseptal cellulitis, orbital cellulitis, odontogenic infection  No evidence of odontogenic infection on exam, doubt orbital cellulitis, no pain with orbital movements but will send for CT to rule out orbital cellulitis.  Lab work reviewed, elevated white blood cell count consistent with infection, BMP unremarkable.  CT scan not consistent with orbital cellulitis, sinusitis noted on exam  We will treat the patient with Augmentin, considered admission however patient overall quite well-appearing afebrile and comfortable on exam.  She knows to return if any worsening.          FINAL CLINICAL IMPRESSION(S) / ED DIAGNOSES  Final diagnoses:  Acute maxillary sinusitis, recurrence not specified     Rx / DC Orders   ED Discharge Orders          Ordered    amoxicillin-clavulanate (AUGMENTIN) 875-125 MG tablet  2 times daily        07/07/21 1034             Note:  This document was prepared using Dragon voice recognition software and may include unintentional dictation errors.   Lavonia Drafts, MD 07/07/21 1045

## 2021-07-13 DIAGNOSIS — F03A0 Unspecified dementia, mild, without behavioral disturbance, psychotic disturbance, mood disturbance, and anxiety: Secondary | ICD-10-CM | POA: Diagnosis not present

## 2021-07-13 DIAGNOSIS — Z0000 Encounter for general adult medical examination without abnormal findings: Secondary | ICD-10-CM | POA: Diagnosis not present

## 2021-07-13 DIAGNOSIS — J329 Chronic sinusitis, unspecified: Secondary | ICD-10-CM | POA: Diagnosis not present

## 2021-07-13 DIAGNOSIS — Z1389 Encounter for screening for other disorder: Secondary | ICD-10-CM | POA: Diagnosis not present

## 2021-07-19 DIAGNOSIS — E538 Deficiency of other specified B group vitamins: Secondary | ICD-10-CM | POA: Diagnosis not present

## 2021-07-19 DIAGNOSIS — D510 Vitamin B12 deficiency anemia due to intrinsic factor deficiency: Secondary | ICD-10-CM | POA: Diagnosis not present

## 2021-08-20 DIAGNOSIS — D510 Vitamin B12 deficiency anemia due to intrinsic factor deficiency: Secondary | ICD-10-CM | POA: Diagnosis not present

## 2021-08-20 DIAGNOSIS — E538 Deficiency of other specified B group vitamins: Secondary | ICD-10-CM | POA: Diagnosis not present

## 2021-08-23 DIAGNOSIS — Z0100 Encounter for examination of eyes and vision without abnormal findings: Secondary | ICD-10-CM | POA: Diagnosis not present

## 2021-08-23 DIAGNOSIS — M3501 Sicca syndrome with keratoconjunctivitis: Secondary | ICD-10-CM | POA: Diagnosis not present

## 2021-09-04 DIAGNOSIS — H524 Presbyopia: Secondary | ICD-10-CM | POA: Diagnosis not present

## 2021-09-20 DIAGNOSIS — D510 Vitamin B12 deficiency anemia due to intrinsic factor deficiency: Secondary | ICD-10-CM | POA: Diagnosis not present

## 2021-09-25 DIAGNOSIS — N3090 Cystitis, unspecified without hematuria: Secondary | ICD-10-CM | POA: Diagnosis not present

## 2021-10-16 DIAGNOSIS — Z23 Encounter for immunization: Secondary | ICD-10-CM | POA: Diagnosis not present

## 2021-10-16 DIAGNOSIS — G4710 Hypersomnia, unspecified: Secondary | ICD-10-CM | POA: Diagnosis not present

## 2021-10-23 DIAGNOSIS — D510 Vitamin B12 deficiency anemia due to intrinsic factor deficiency: Secondary | ICD-10-CM | POA: Diagnosis not present

## 2021-11-23 DIAGNOSIS — D510 Vitamin B12 deficiency anemia due to intrinsic factor deficiency: Secondary | ICD-10-CM | POA: Diagnosis not present

## 2021-12-25 DIAGNOSIS — D510 Vitamin B12 deficiency anemia due to intrinsic factor deficiency: Secondary | ICD-10-CM | POA: Diagnosis not present

## 2022-01-22 DIAGNOSIS — R739 Hyperglycemia, unspecified: Secondary | ICD-10-CM | POA: Diagnosis not present

## 2022-01-22 DIAGNOSIS — D510 Vitamin B12 deficiency anemia due to intrinsic factor deficiency: Secondary | ICD-10-CM | POA: Diagnosis not present

## 2022-01-22 DIAGNOSIS — E782 Mixed hyperlipidemia: Secondary | ICD-10-CM | POA: Diagnosis not present

## 2022-01-24 DIAGNOSIS — M542 Cervicalgia: Secondary | ICD-10-CM | POA: Diagnosis not present

## 2022-01-24 DIAGNOSIS — Z0000 Encounter for general adult medical examination without abnormal findings: Secondary | ICD-10-CM | POA: Diagnosis not present

## 2022-01-24 DIAGNOSIS — E538 Deficiency of other specified B group vitamins: Secondary | ICD-10-CM | POA: Diagnosis not present

## 2022-01-24 DIAGNOSIS — F32A Depression, unspecified: Secondary | ICD-10-CM | POA: Diagnosis not present

## 2022-02-25 DIAGNOSIS — D510 Vitamin B12 deficiency anemia due to intrinsic factor deficiency: Secondary | ICD-10-CM | POA: Diagnosis not present

## 2022-03-28 DIAGNOSIS — D510 Vitamin B12 deficiency anemia due to intrinsic factor deficiency: Secondary | ICD-10-CM | POA: Diagnosis not present

## 2022-04-08 DIAGNOSIS — R002 Palpitations: Secondary | ICD-10-CM | POA: Diagnosis not present

## 2022-04-08 DIAGNOSIS — I2089 Other forms of angina pectoris: Secondary | ICD-10-CM | POA: Diagnosis not present

## 2022-04-08 DIAGNOSIS — M542 Cervicalgia: Secondary | ICD-10-CM | POA: Diagnosis not present

## 2022-04-08 DIAGNOSIS — J439 Emphysema, unspecified: Secondary | ICD-10-CM | POA: Diagnosis not present

## 2022-04-08 DIAGNOSIS — R634 Abnormal weight loss: Secondary | ICD-10-CM | POA: Diagnosis not present

## 2022-04-08 DIAGNOSIS — R079 Chest pain, unspecified: Secondary | ICD-10-CM | POA: Diagnosis not present

## 2022-04-18 IMAGING — CT CT KNEE*R* W/O CM
3 series · 12 of 33 positions shown, 14 images · non-contrast
Comparison: Right knee x-rays from same day.

CLINICAL DATA: Right knee injury after tripping over her dog last
night.

EXAM:
CT OF THE RIGHT KNEE WITHOUT CONTRAST
TECHNIQUE: Multidetector CT imaging of the right knee was performed according
to the standard protocol. Multiplanar CT image reconstructions were
also generated.

[Series 5: axial st · axial · 0.34mm/px · z∈[+572,+730]mm · 4 of 153 slices shown, 5 images]
[im 24/153  soft-tissue]
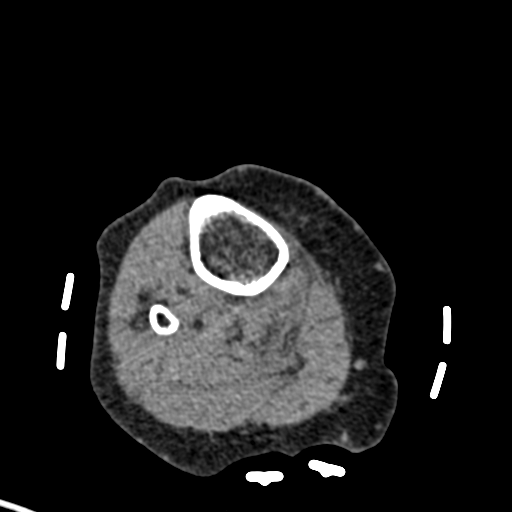
[im 24/153  bone]
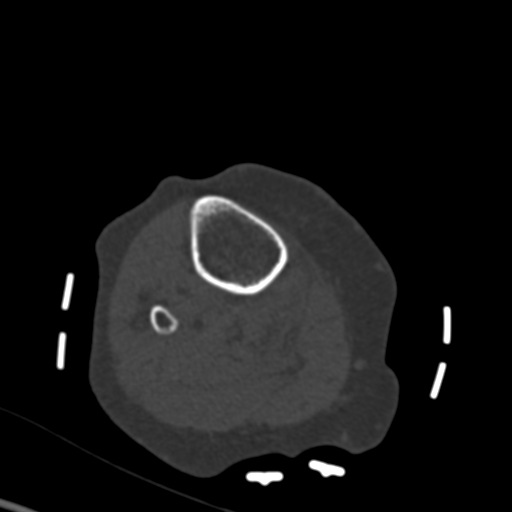
[im 59/153  bone]
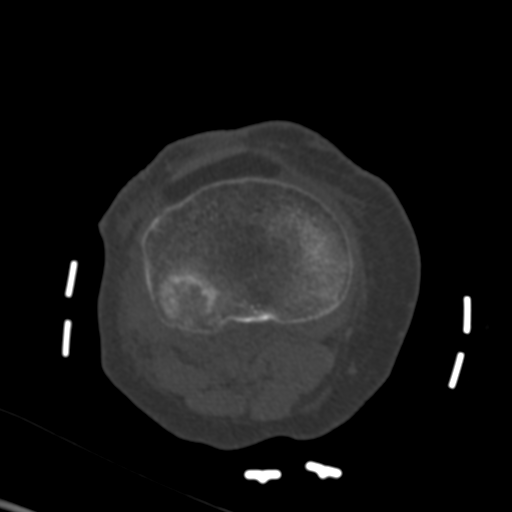
[im 94/153  bone]
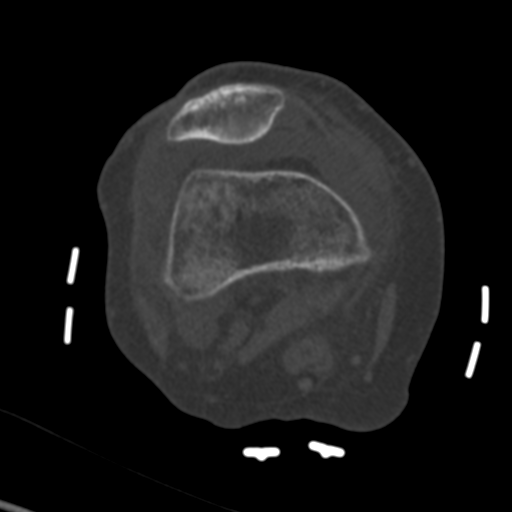
[im 129/153  bone]
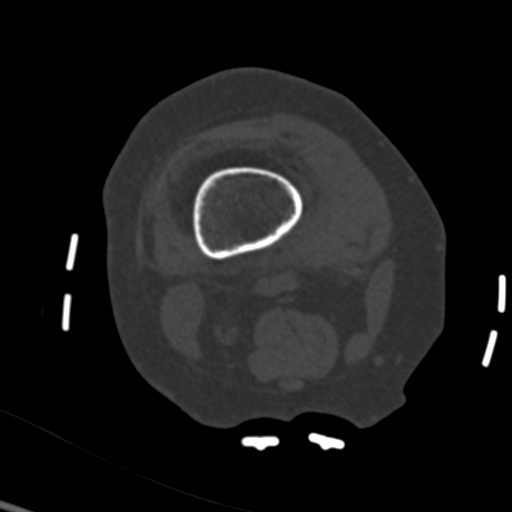

[Series 7: coronal st · coronal · 0.24mm/px · 3 of 97 slices shown]
[im 20/97  bone]
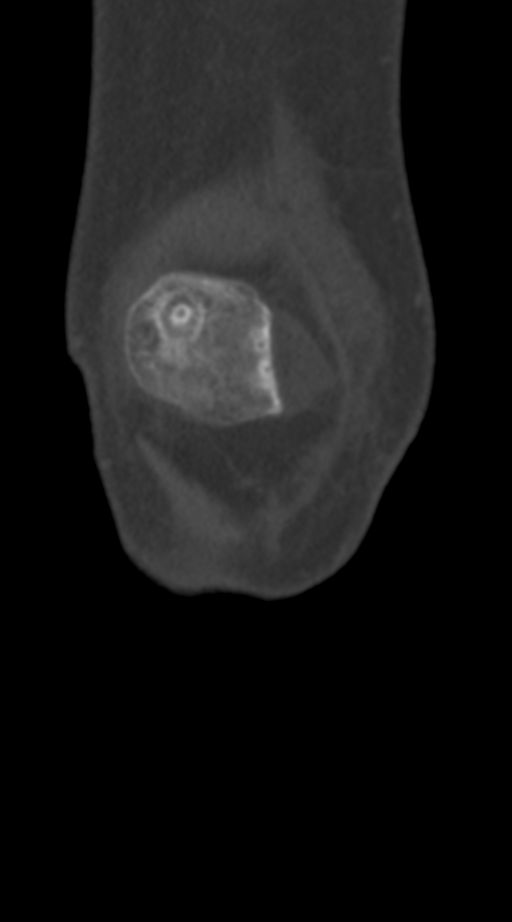
[im 39/97  bone]
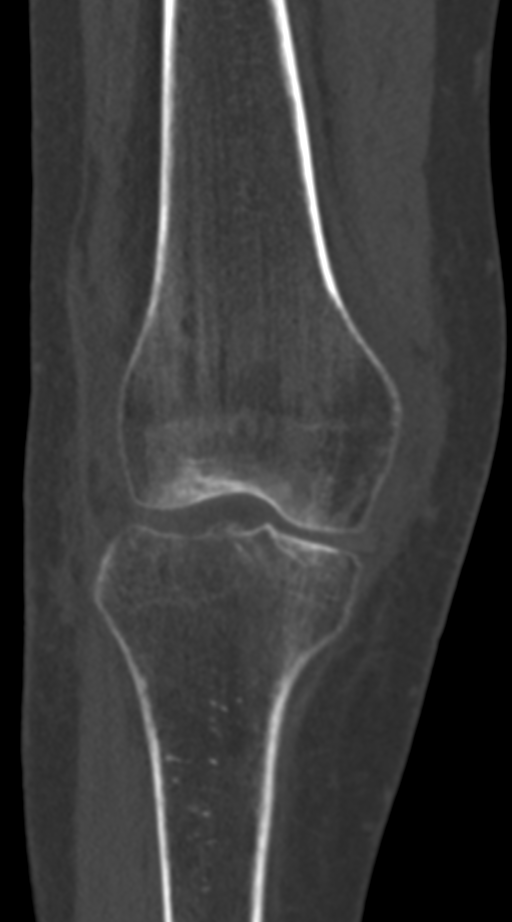
[im 58/97  bone]
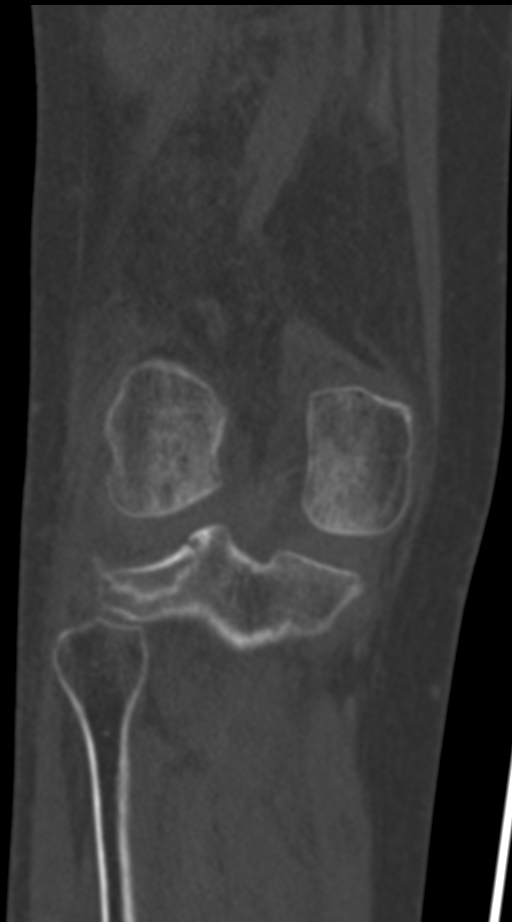

[Series 8: sagittal st · sagittal · 0.28mm/px · 5 of 84 slices shown, 6 images]
[im 28/84  bone]
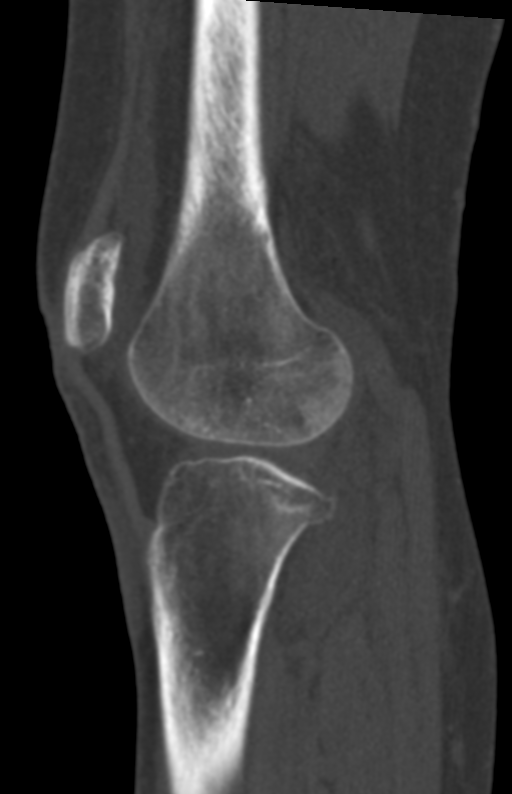
[im 35/84  bone]
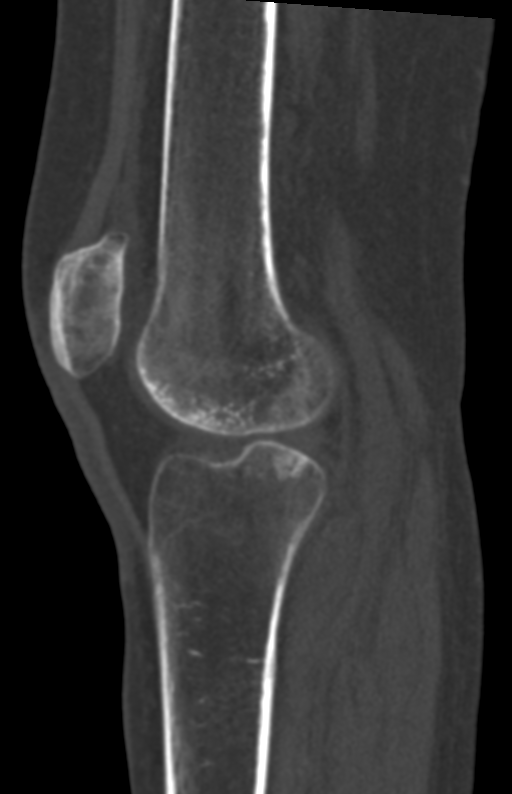
[im 42/84  soft-tissue]
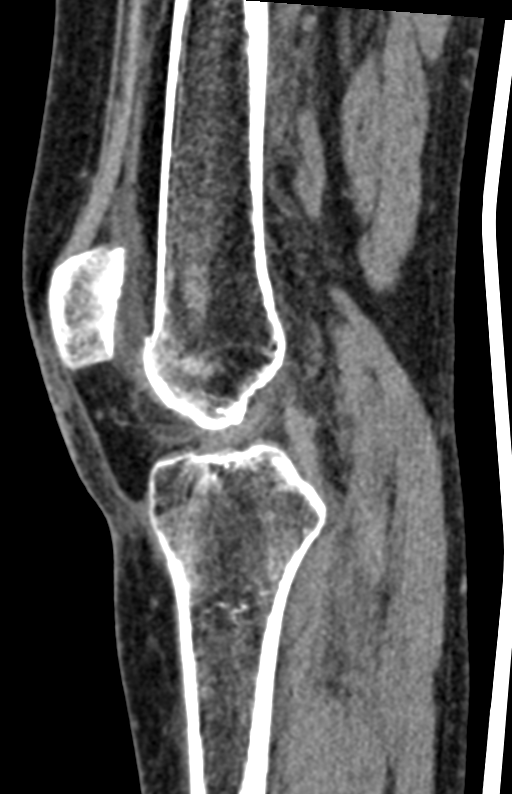
[im 42/84  bone]
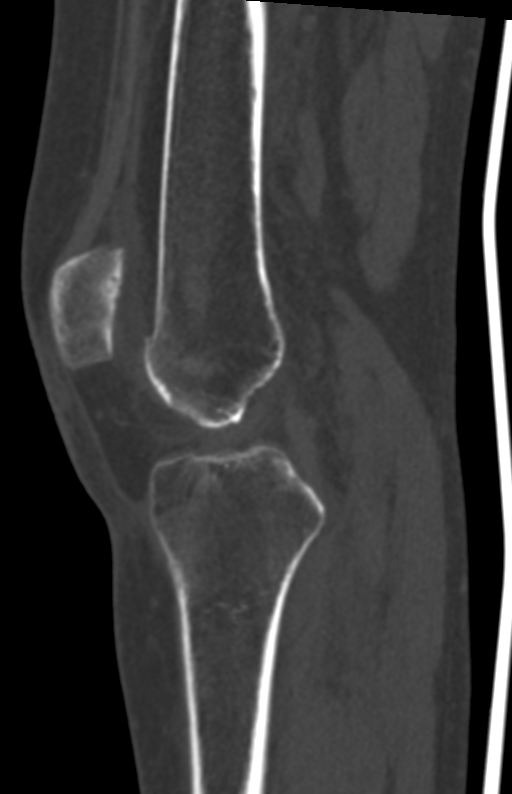
[im 49/84  bone]
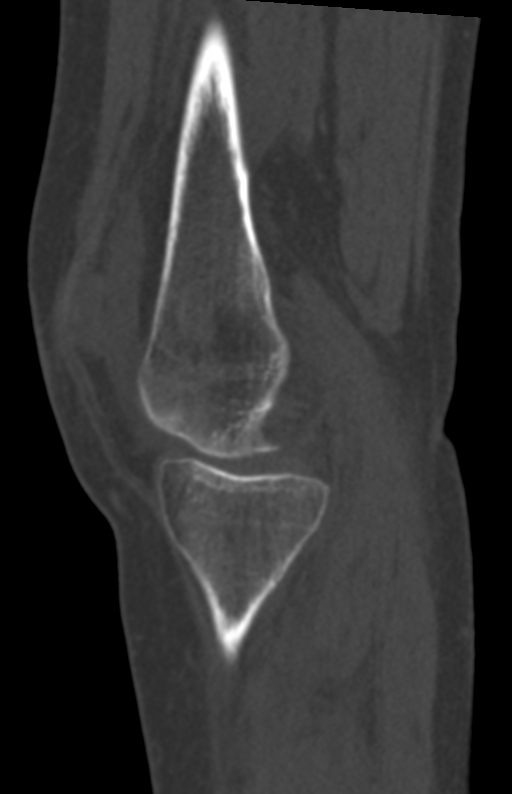
[im 56/84  bone]
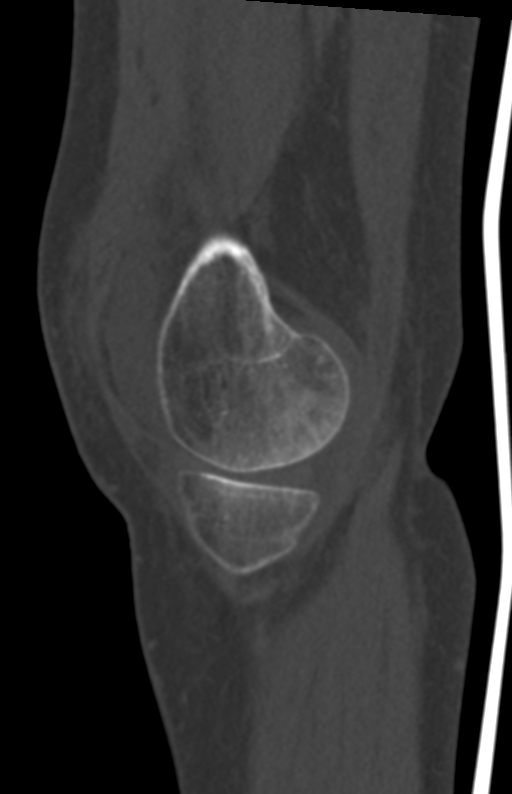

[12 of 33 positions shown; findings below may reference images not displayed]

FINDINGS: Bones/Joint/Cartilage

Acute mildly depressed fracture of the lateral tibial plateau.
Depression measures up to 7-8 mm posteriorly. No additional
fracture. No dislocation. Joint spaces are preserved. Moderate
lipohemarthrosis. Osteopenia.

Ligaments

Ligaments are suboptimally evaluated by CT.

Muscles and Tendons
Grossly intact.

Soft tissue
No fluid collection or hematoma.  No soft tissue mass.
IMPRESSION: 1. Acute mildly depressed fracture of the lateral tibial plateau.
2. Moderate lipohemarthrosis.

## 2022-04-18 IMAGING — CR DG KNEE COMPLETE 4+V*R*
1 series · 4 of 4 positions shown · non-contrast
Comparison: None.

CLINICAL DATA: Fall

EXAM:
RIGHT KNEE - COMPLETE 4+ VIEW

[Series 1: dg knee complete 4 views right · 0.14mm/px · 4 of 4 slices shown]
[im 1/4]
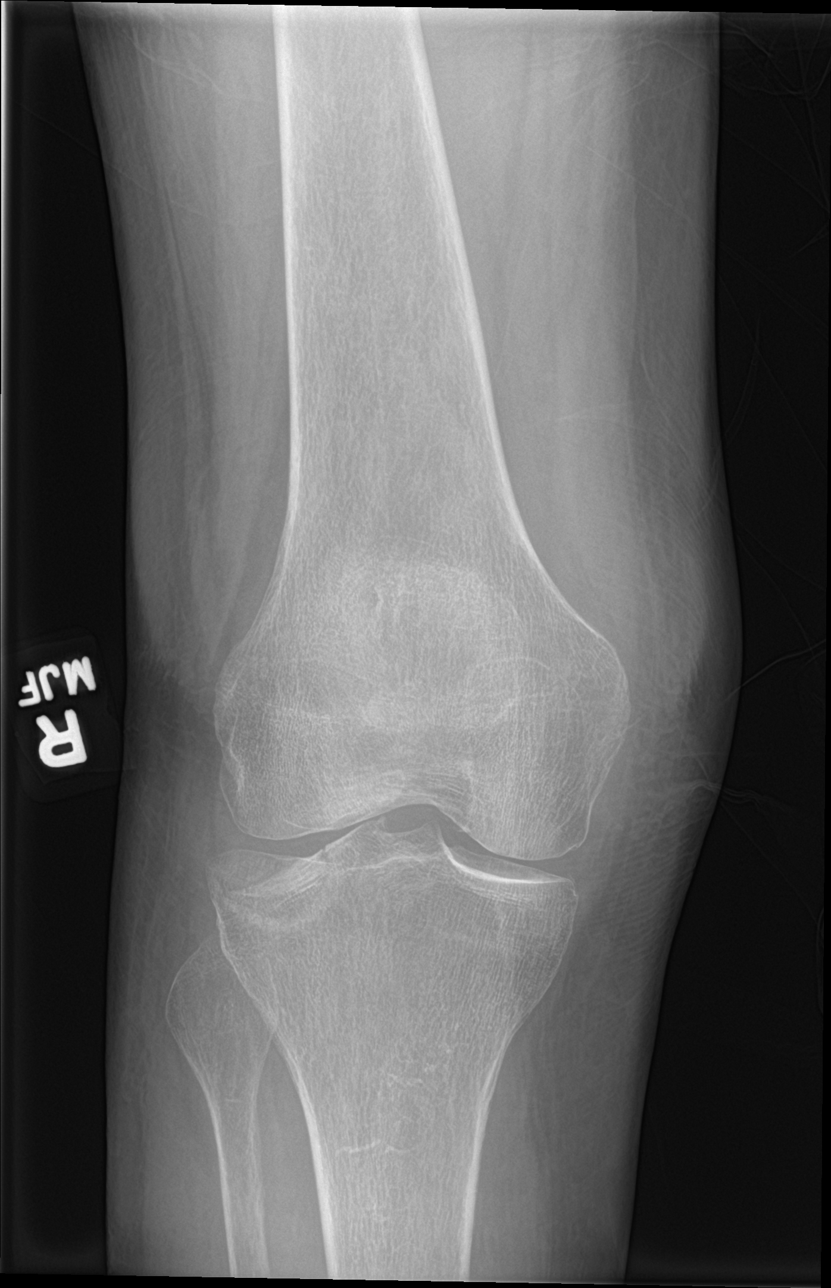
[im 2/4]
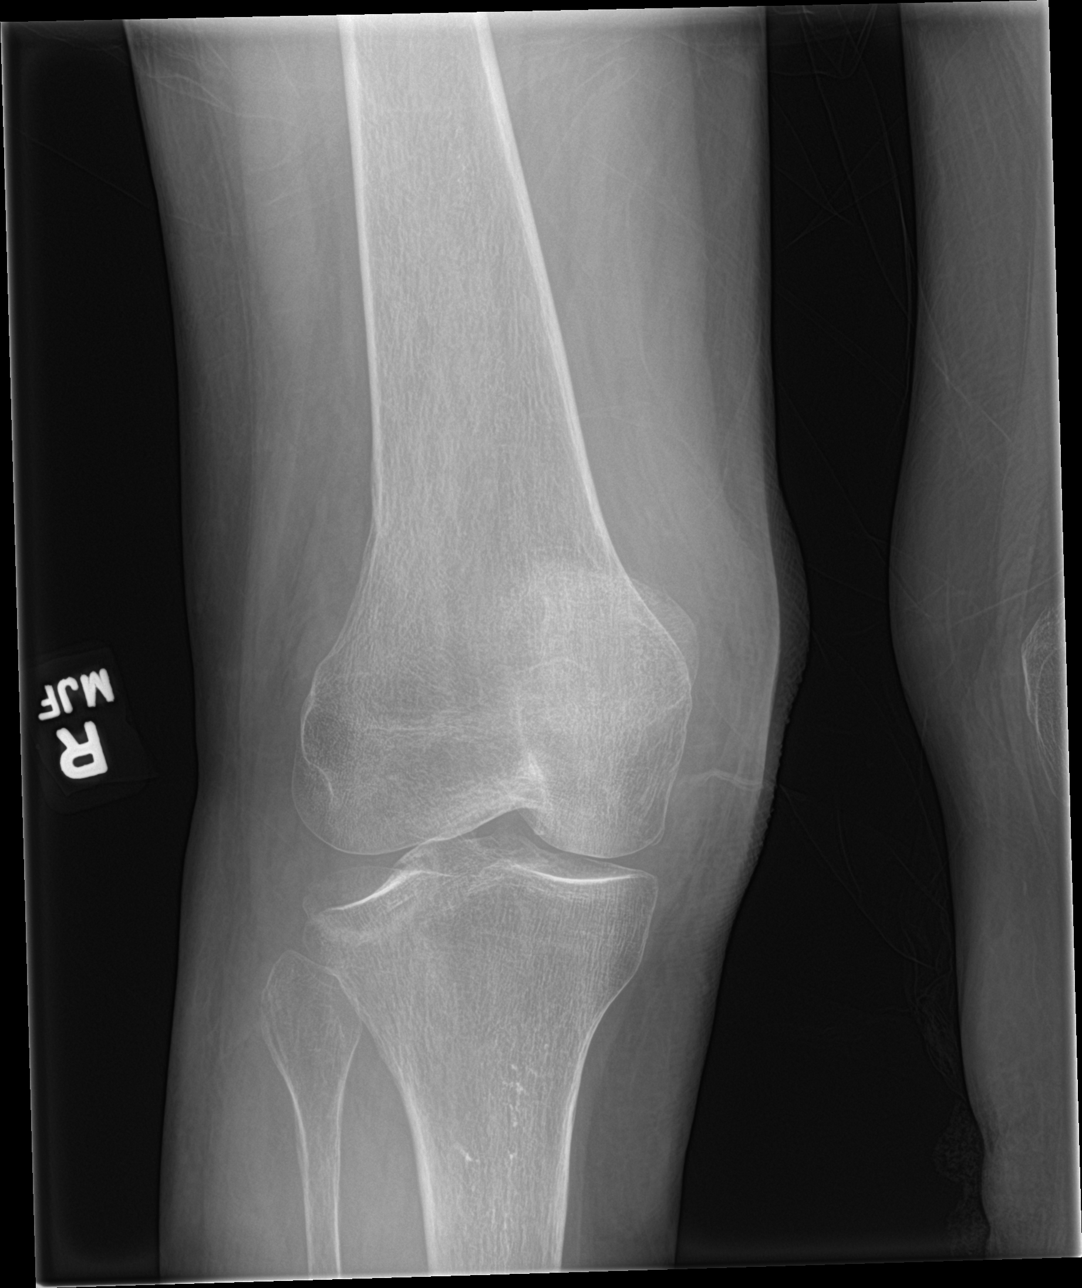
[im 3/4]
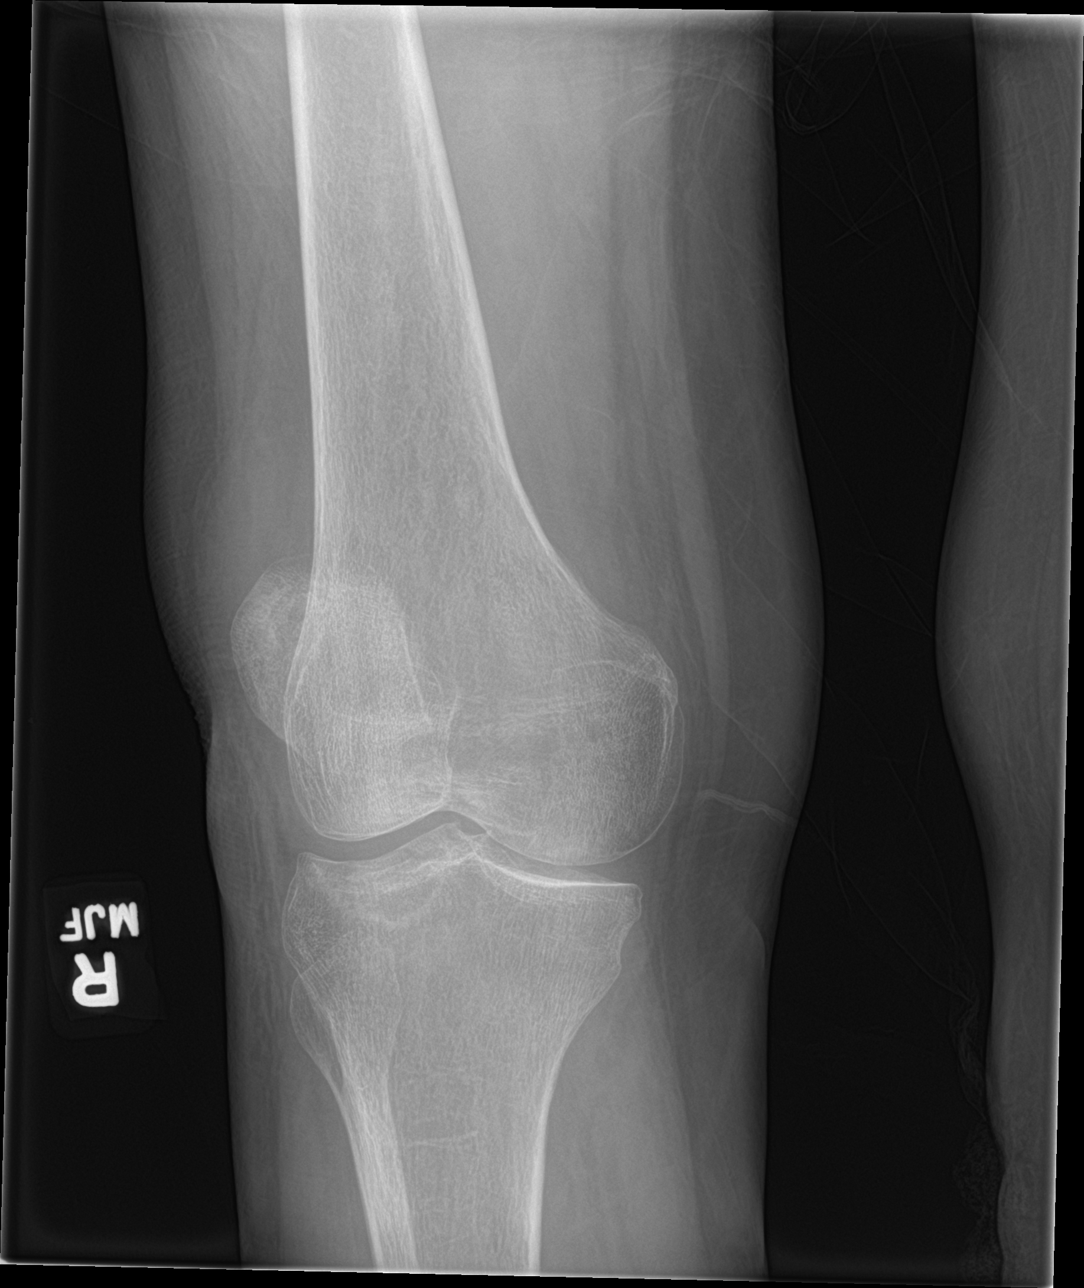
[im 4/4]
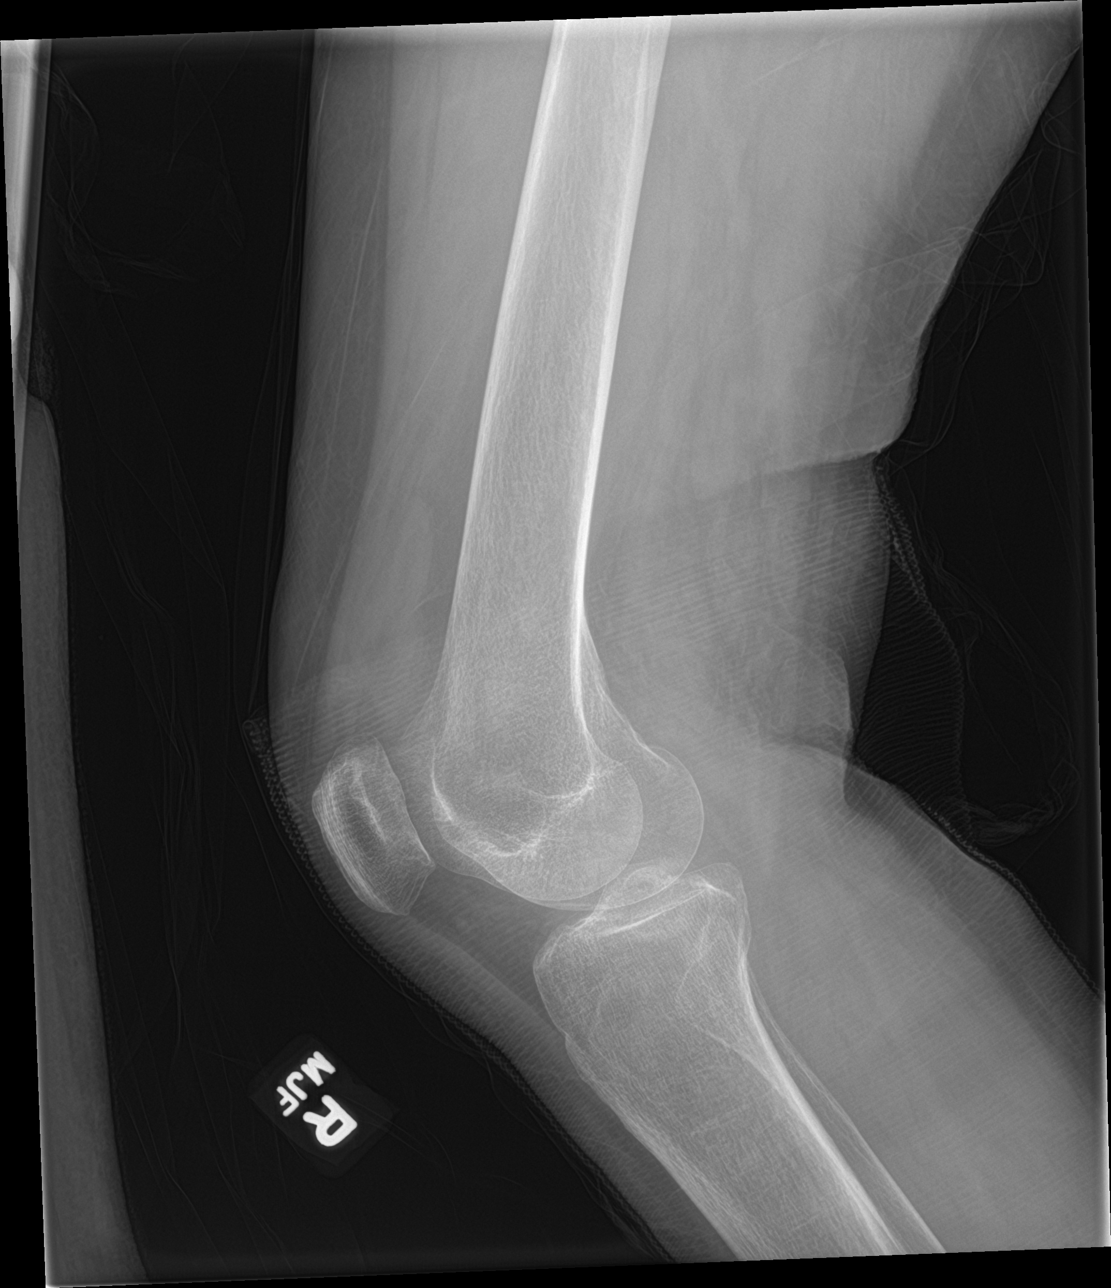

[4 of 4 positions shown; findings below may reference images not displayed]

FINDINGS: Mildly depressed fracture lateral tibial plateau. No other fracture.
There is a joint effusion. No significant joint space narrowing.
IMPRESSION: Mildly depressed fracture lateral tibial plateau.

## 2022-04-18 IMAGING — CR DG HIP (WITH OR WITHOUT PELVIS) 2-3V*R*
1 series · 3 of 3 positions shown · non-contrast
Comparison: None.

CLINICAL DATA: Fall. Additional history provided: Right leg injury.

EXAM:
DG HIP (WITH OR WITHOUT PELVIS) 2-3V RIGHT

[Series 1: dg hip unilat w or w/o pelvis 2-3 views  · non-contrast · 0.14mm/px · 3 of 3 slices shown]
[im 1/3]
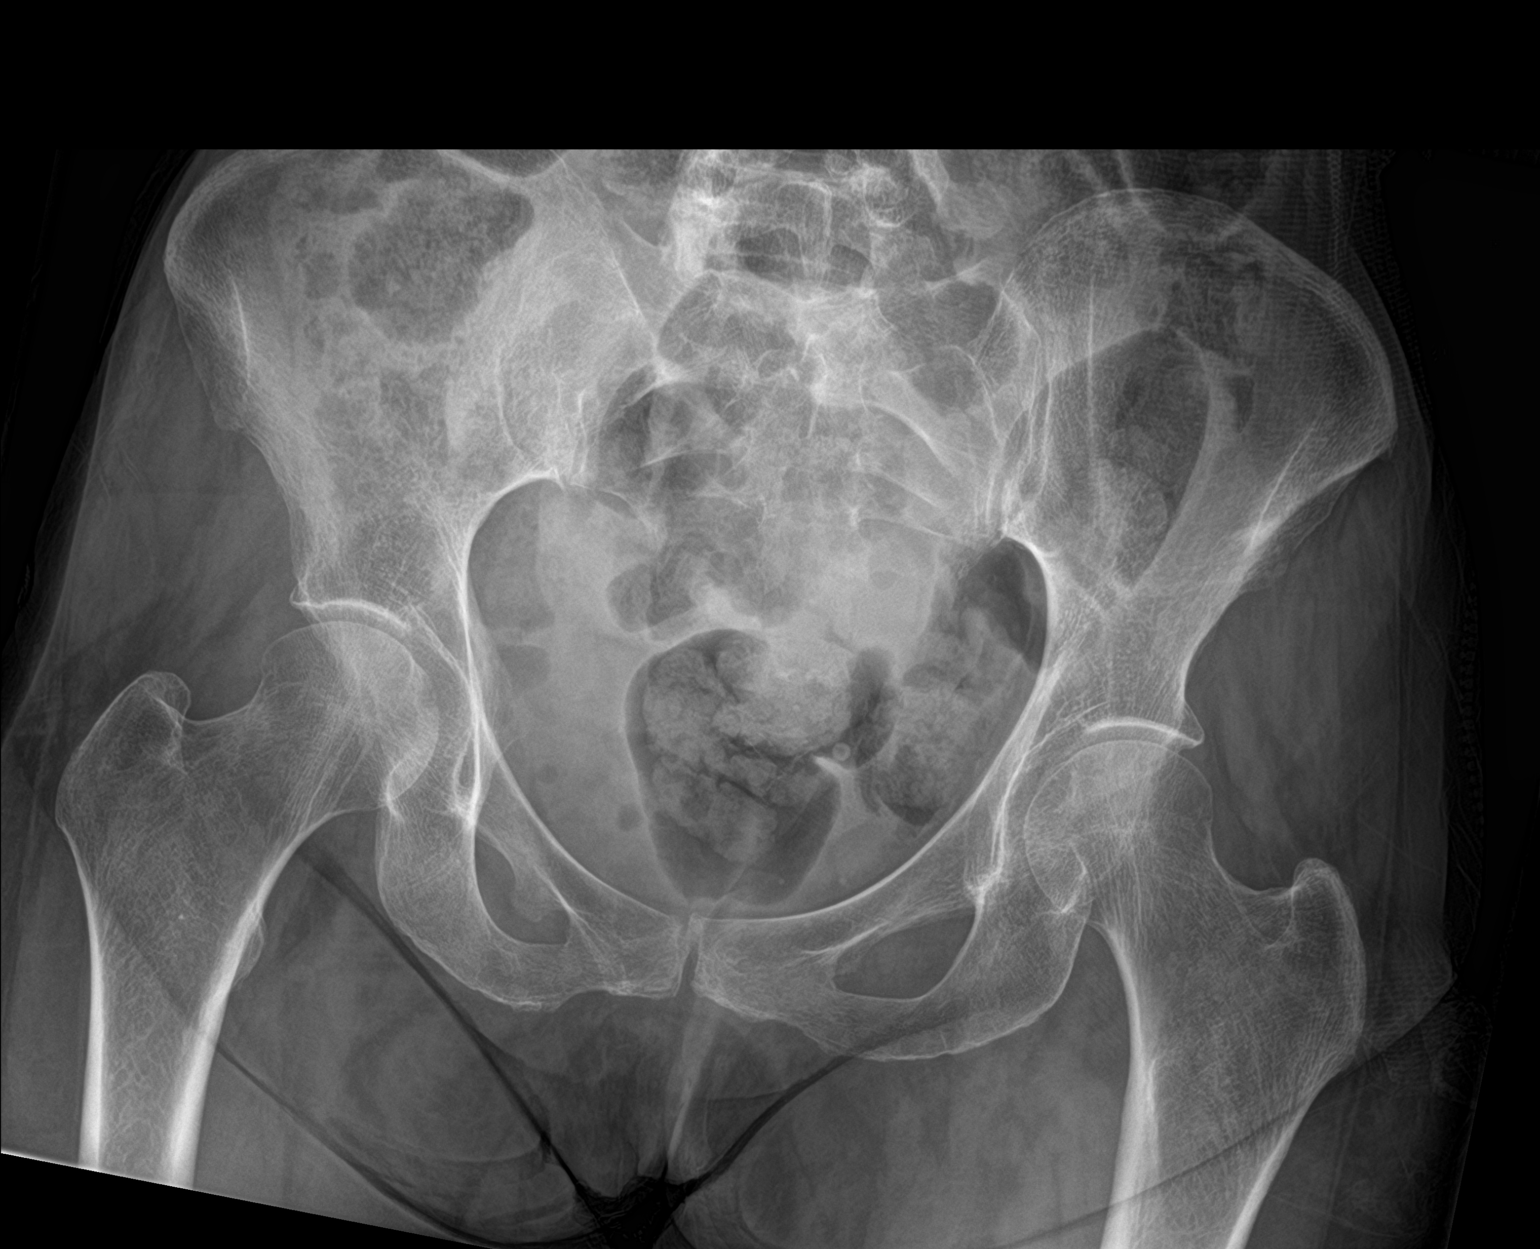
[im 2/3]
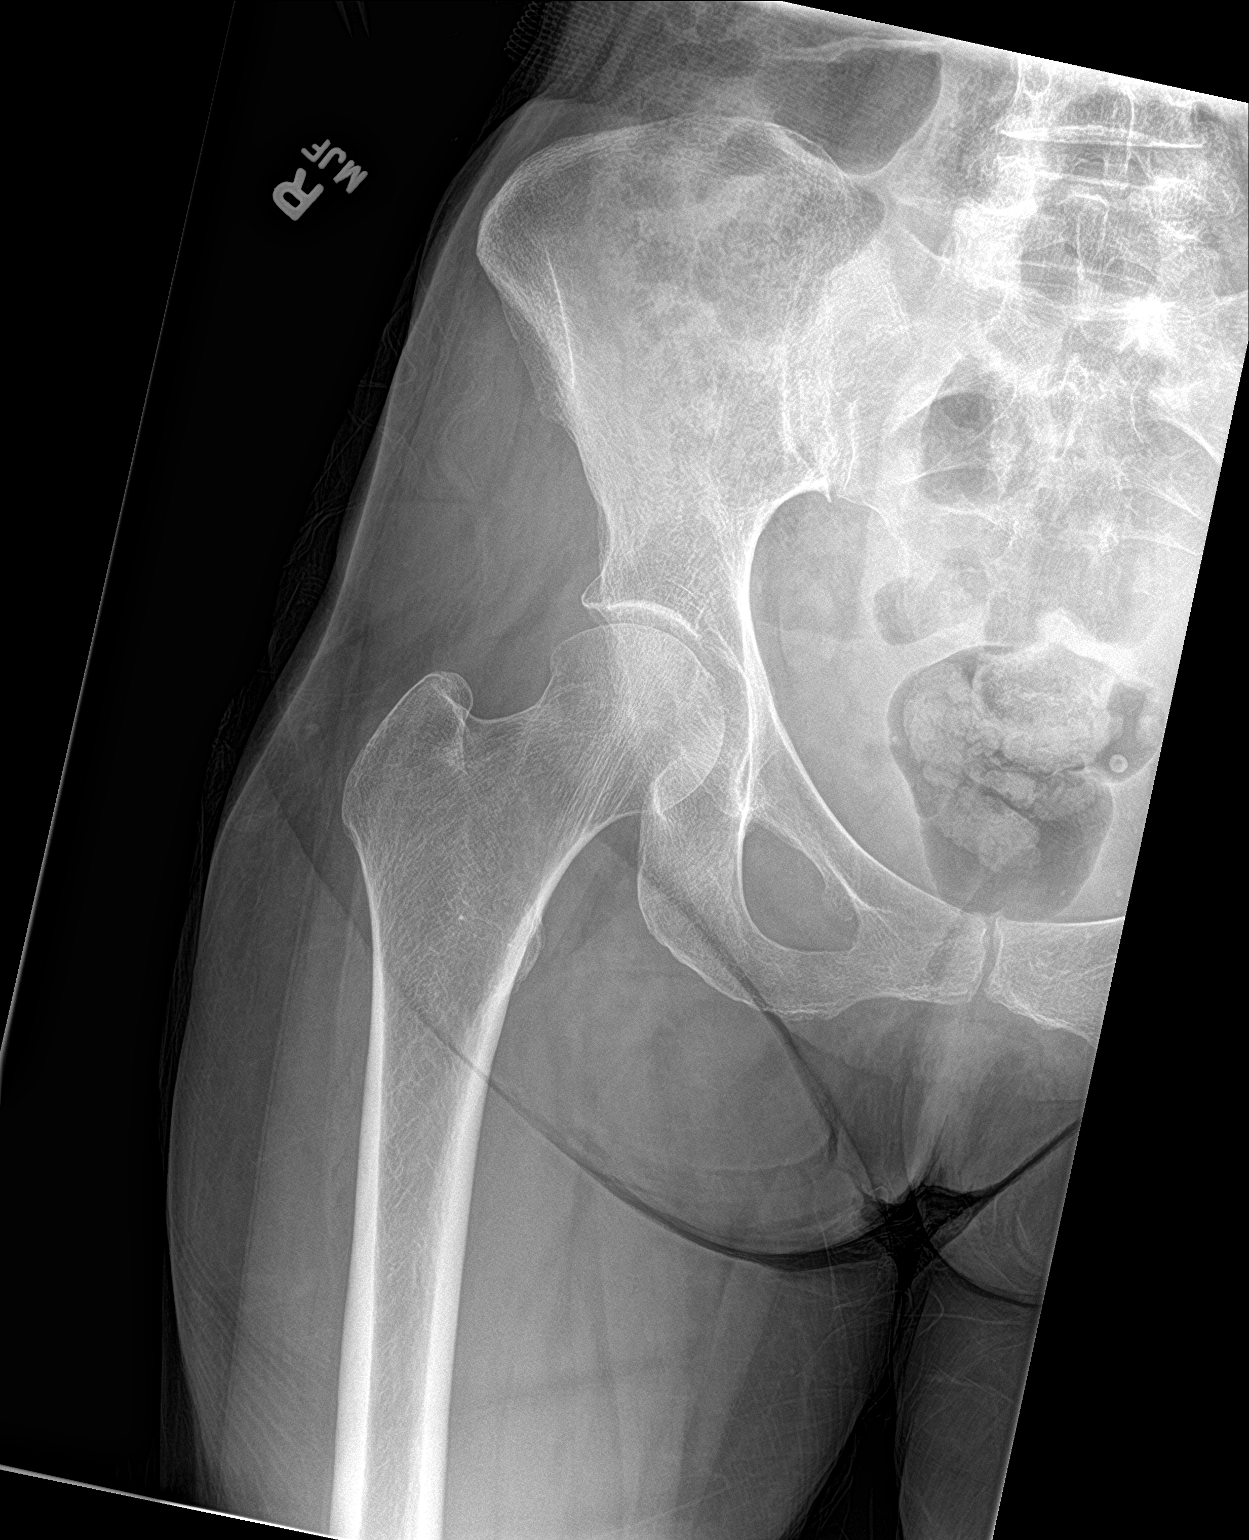
[im 3/3]
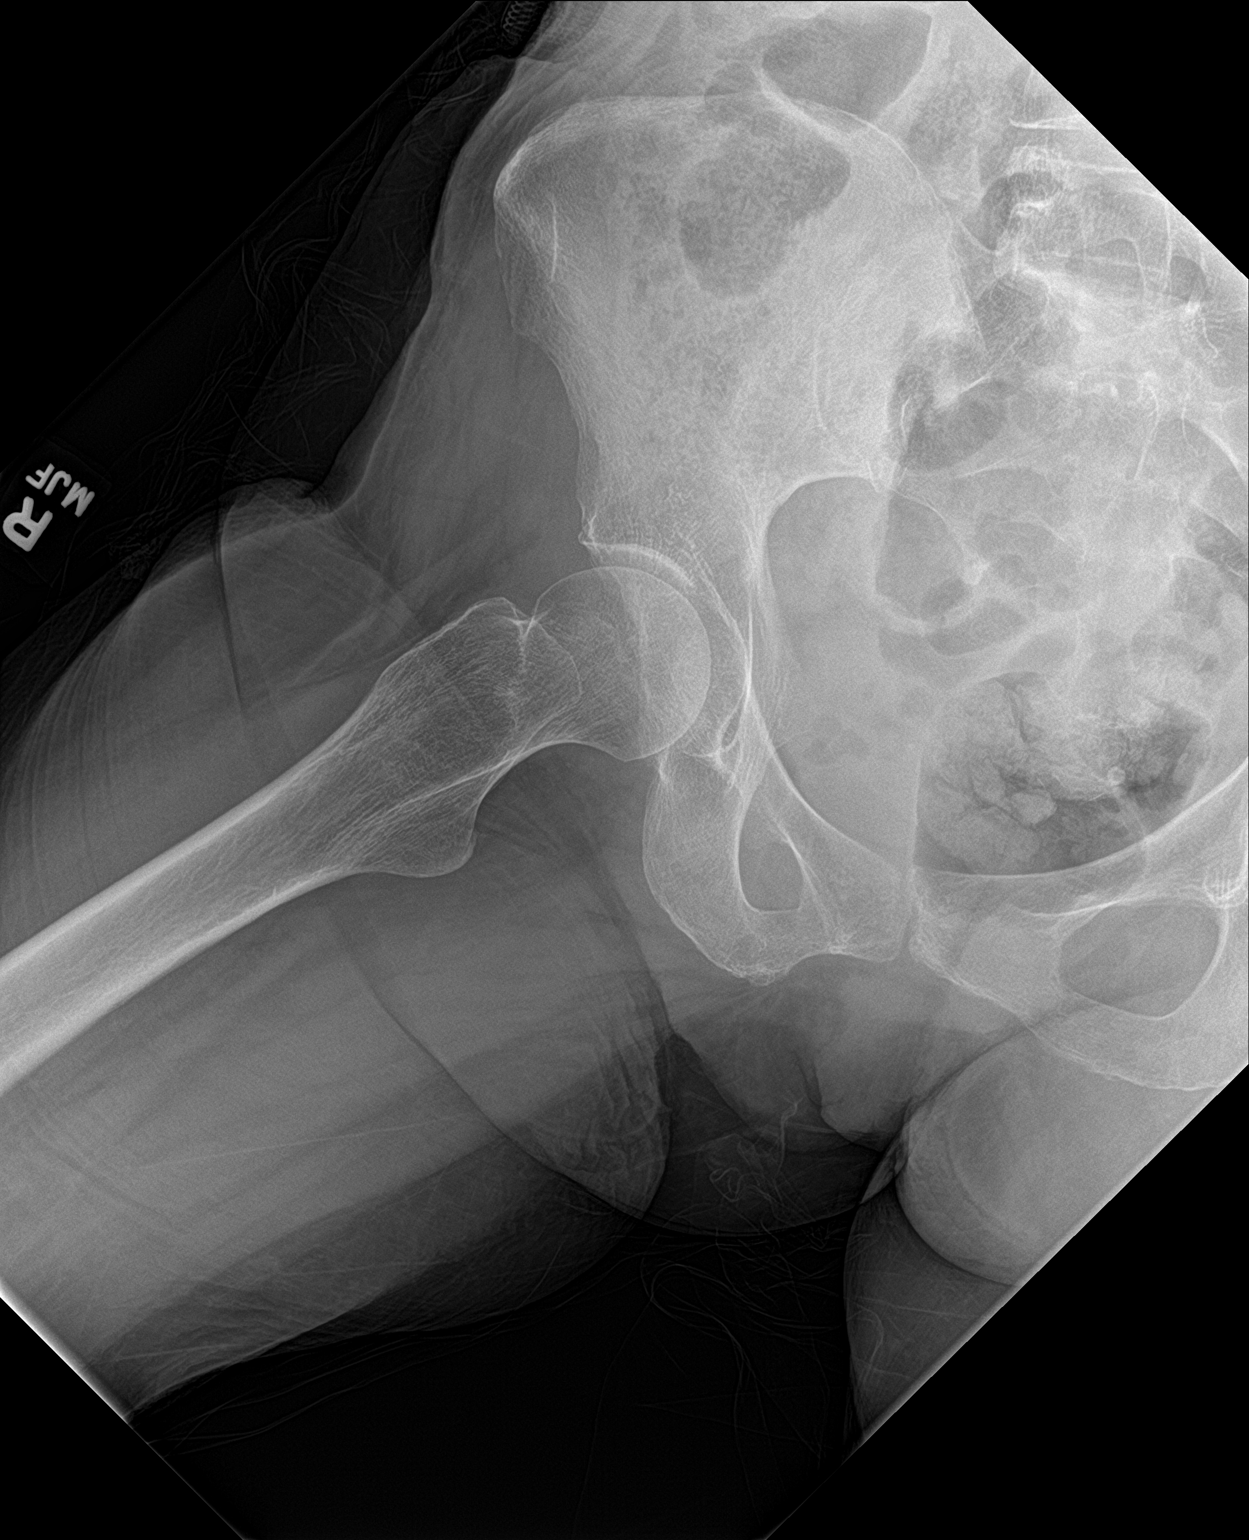

[3 of 3 positions shown; findings below may reference images not displayed]

FINDINGS: There is normal bony alignment.

No evidence of acute osseous or articular abnormality.

The joint spaces are maintained.
IMPRESSION: No evidence of acute osseous or articular abnormality.

## 2022-04-29 DIAGNOSIS — E538 Deficiency of other specified B group vitamins: Secondary | ICD-10-CM | POA: Diagnosis not present

## 2022-05-15 DIAGNOSIS — I2089 Other forms of angina pectoris: Secondary | ICD-10-CM | POA: Diagnosis not present

## 2022-05-22 DIAGNOSIS — R0789 Other chest pain: Secondary | ICD-10-CM | POA: Diagnosis not present

## 2022-05-30 DIAGNOSIS — D510 Vitamin B12 deficiency anemia due to intrinsic factor deficiency: Secondary | ICD-10-CM | POA: Diagnosis not present

## 2022-07-01 DIAGNOSIS — G40909 Epilepsy, unspecified, not intractable, without status epilepticus: Secondary | ICD-10-CM | POA: Diagnosis not present

## 2022-07-01 DIAGNOSIS — R0789 Other chest pain: Secondary | ICD-10-CM | POA: Diagnosis not present

## 2022-07-01 DIAGNOSIS — F0280 Dementia in other diseases classified elsewhere without behavioral disturbance: Secondary | ICD-10-CM | POA: Diagnosis not present

## 2022-07-01 DIAGNOSIS — E538 Deficiency of other specified B group vitamins: Secondary | ICD-10-CM | POA: Diagnosis not present

## 2022-07-01 DIAGNOSIS — F32A Depression, unspecified: Secondary | ICD-10-CM | POA: Diagnosis not present

## 2022-07-02 ENCOUNTER — Other Ambulatory Visit: Payer: Self-pay | Admitting: Internal Medicine

## 2022-07-02 DIAGNOSIS — Z1231 Encounter for screening mammogram for malignant neoplasm of breast: Secondary | ICD-10-CM

## 2022-07-11 ENCOUNTER — Ambulatory Visit
Admission: RE | Admit: 2022-07-11 | Discharge: 2022-07-11 | Disposition: A | Discharge status: Home / Self Care | Payer: Medicare HMO | Source: Ambulatory Visit | Attending: Internal Medicine | Admitting: Internal Medicine

## 2022-07-11 DIAGNOSIS — Z1231 Encounter for screening mammogram for malignant neoplasm of breast: Secondary | ICD-10-CM | POA: Insufficient documentation

## 2022-07-19 DIAGNOSIS — E538 Deficiency of other specified B group vitamins: Secondary | ICD-10-CM | POA: Diagnosis not present

## 2022-07-19 DIAGNOSIS — R739 Hyperglycemia, unspecified: Secondary | ICD-10-CM | POA: Diagnosis not present

## 2022-07-19 DIAGNOSIS — E782 Mixed hyperlipidemia: Secondary | ICD-10-CM | POA: Diagnosis not present

## 2022-07-26 DIAGNOSIS — F32A Depression, unspecified: Secondary | ICD-10-CM | POA: Diagnosis not present

## 2022-07-26 DIAGNOSIS — Z0000 Encounter for general adult medical examination without abnormal findings: Secondary | ICD-10-CM | POA: Diagnosis not present

## 2022-07-26 DIAGNOSIS — Z1331 Encounter for screening for depression: Secondary | ICD-10-CM | POA: Diagnosis not present

## 2022-07-26 DIAGNOSIS — E538 Deficiency of other specified B group vitamins: Secondary | ICD-10-CM | POA: Diagnosis not present

## 2022-08-02 DIAGNOSIS — E538 Deficiency of other specified B group vitamins: Secondary | ICD-10-CM | POA: Diagnosis not present

## 2022-08-26 DIAGNOSIS — Z961 Presence of intraocular lens: Secondary | ICD-10-CM | POA: Diagnosis not present

## 2022-08-26 DIAGNOSIS — H43813 Vitreous degeneration, bilateral: Secondary | ICD-10-CM | POA: Diagnosis not present

## 2022-08-26 DIAGNOSIS — M3501 Sicca syndrome with keratoconjunctivitis: Secondary | ICD-10-CM | POA: Diagnosis not present

## 2022-08-26 DIAGNOSIS — Z0100 Encounter for examination of eyes and vision without abnormal findings: Secondary | ICD-10-CM | POA: Diagnosis not present

## 2022-08-28 DIAGNOSIS — H524 Presbyopia: Secondary | ICD-10-CM | POA: Diagnosis not present

## 2022-09-04 DIAGNOSIS — D510 Vitamin B12 deficiency anemia due to intrinsic factor deficiency: Secondary | ICD-10-CM | POA: Diagnosis not present

## 2022-10-07 DIAGNOSIS — E538 Deficiency of other specified B group vitamins: Secondary | ICD-10-CM | POA: Diagnosis not present

## 2022-11-07 DIAGNOSIS — E538 Deficiency of other specified B group vitamins: Secondary | ICD-10-CM | POA: Diagnosis not present

## 2022-12-09 DIAGNOSIS — E538 Deficiency of other specified B group vitamins: Secondary | ICD-10-CM | POA: Diagnosis not present

## 2023-01-09 DIAGNOSIS — D510 Vitamin B12 deficiency anemia due to intrinsic factor deficiency: Secondary | ICD-10-CM | POA: Diagnosis not present

## 2023-01-09 DIAGNOSIS — E538 Deficiency of other specified B group vitamins: Secondary | ICD-10-CM | POA: Diagnosis not present

## 2023-01-23 DIAGNOSIS — E782 Mixed hyperlipidemia: Secondary | ICD-10-CM | POA: Diagnosis not present

## 2023-01-23 DIAGNOSIS — E538 Deficiency of other specified B group vitamins: Secondary | ICD-10-CM | POA: Diagnosis not present

## 2023-01-30 DIAGNOSIS — E538 Deficiency of other specified B group vitamins: Secondary | ICD-10-CM | POA: Diagnosis not present

## 2023-01-30 DIAGNOSIS — L820 Inflamed seborrheic keratosis: Secondary | ICD-10-CM | POA: Diagnosis not present

## 2023-01-30 DIAGNOSIS — Z0000 Encounter for general adult medical examination without abnormal findings: Secondary | ICD-10-CM | POA: Diagnosis not present

## 2023-01-30 DIAGNOSIS — G40909 Epilepsy, unspecified, not intractable, without status epilepticus: Secondary | ICD-10-CM | POA: Diagnosis not present

## 2023-01-30 DIAGNOSIS — F32A Depression, unspecified: Secondary | ICD-10-CM | POA: Diagnosis not present

## 2023-02-11 DIAGNOSIS — E538 Deficiency of other specified B group vitamins: Secondary | ICD-10-CM | POA: Diagnosis not present

## 2023-02-11 DIAGNOSIS — D510 Vitamin B12 deficiency anemia due to intrinsic factor deficiency: Secondary | ICD-10-CM | POA: Diagnosis not present

## 2023-03-14 DIAGNOSIS — E538 Deficiency of other specified B group vitamins: Secondary | ICD-10-CM | POA: Diagnosis not present

## 2023-04-14 DIAGNOSIS — E538 Deficiency of other specified B group vitamins: Secondary | ICD-10-CM | POA: Diagnosis not present

## 2023-04-16 ENCOUNTER — Other Ambulatory Visit: Payer: Self-pay | Admitting: Internal Medicine

## 2023-04-16 ENCOUNTER — Ambulatory Visit
Admission: RE | Admit: 2023-04-16 | Discharge: 2023-04-16 | Disposition: A | Discharge status: Home / Self Care | Source: Ambulatory Visit | Attending: Internal Medicine | Admitting: Internal Medicine

## 2023-04-16 DIAGNOSIS — R1030 Lower abdominal pain, unspecified: Secondary | ICD-10-CM | POA: Diagnosis not present

## 2023-04-16 DIAGNOSIS — K625 Hemorrhage of anus and rectum: Secondary | ICD-10-CM | POA: Diagnosis not present

## 2023-04-16 DIAGNOSIS — F32A Depression, unspecified: Secondary | ICD-10-CM | POA: Diagnosis not present

## 2023-04-16 DIAGNOSIS — K6289 Other specified diseases of anus and rectum: Secondary | ICD-10-CM

## 2023-04-16 DIAGNOSIS — R1084 Generalized abdominal pain: Secondary | ICD-10-CM

## 2023-04-16 DIAGNOSIS — Z9049 Acquired absence of other specified parts of digestive tract: Secondary | ICD-10-CM | POA: Diagnosis not present

## 2023-04-16 DIAGNOSIS — K5730 Diverticulosis of large intestine without perforation or abscess without bleeding: Secondary | ICD-10-CM | POA: Diagnosis not present

## 2023-04-16 DIAGNOSIS — D500 Iron deficiency anemia secondary to blood loss (chronic): Secondary | ICD-10-CM | POA: Diagnosis not present

## 2023-04-16 DIAGNOSIS — K922 Gastrointestinal hemorrhage, unspecified: Secondary | ICD-10-CM | POA: Diagnosis not present

## 2023-04-16 MED ORDER — IOHEXOL 300 MG/ML  SOLN
80.0000 mL | Freq: Once | INTRAMUSCULAR | Status: AC | PRN
  Administered 2023-04-16: 80 mL via INTRAVENOUS

## 2023-05-15 DIAGNOSIS — E538 Deficiency of other specified B group vitamins: Secondary | ICD-10-CM | POA: Diagnosis not present

## 2023-06-16 DIAGNOSIS — D510 Vitamin B12 deficiency anemia due to intrinsic factor deficiency: Secondary | ICD-10-CM | POA: Diagnosis not present

## 2023-07-17 DIAGNOSIS — E538 Deficiency of other specified B group vitamins: Secondary | ICD-10-CM | POA: Diagnosis not present

## 2023-07-24 DIAGNOSIS — E782 Mixed hyperlipidemia: Secondary | ICD-10-CM | POA: Diagnosis not present

## 2023-07-24 DIAGNOSIS — D510 Vitamin B12 deficiency anemia due to intrinsic factor deficiency: Secondary | ICD-10-CM | POA: Diagnosis not present

## 2023-07-31 DIAGNOSIS — Z1331 Encounter for screening for depression: Secondary | ICD-10-CM | POA: Diagnosis not present

## 2023-07-31 DIAGNOSIS — E538 Deficiency of other specified B group vitamins: Secondary | ICD-10-CM | POA: Diagnosis not present

## 2023-07-31 DIAGNOSIS — Z0000 Encounter for general adult medical examination without abnormal findings: Secondary | ICD-10-CM | POA: Diagnosis not present

## 2023-08-14 LAB — GLUCOSE, POCT (MANUAL RESULT ENTRY): POC Glucose: 111 mg/dL — AB (ref 70–99)

## 2023-08-14 NOTE — Congregational Nurse Program (Signed)
  Dept: [phone]   Congregational Nurse Program Note  Date of Encounter: 08/14/2023  Past Medical History: Past Medical History:  Diagnosis Date   Anemia    Anxiety    Colon adenomas    Depression    Gastric ulcer    GERD (gastroesophageal reflux disease)    Hyperlipidemia    Hyperlipidemia    Meningioma (HCC)    Seizures (HCC)     Encounter Details:  Community Questionnaire - 08/14/23 1158       Questionnaire   Ask client: Do you give verbal consent for me to treat you today? Yes    Student Assistance N/A    [facility]  [facility]    Encounter Setting CN site    Insurance Medicare    Insurance/Financial Assistance Referral N/A    Medication N/A    Screening Referrals Made N/A    Medical Referrals Made N/A    Medical Appointment Completed N/A    CNP Interventions Advocate/Support    Screenings CN Performed Blood Pressure;Blood Glucose    ED Visit Averted N/A    Life-Saving Intervention Made N/A          Today's Vitals   08/14/23 1158  BP: 110/60   There is no height or weight on file to calculate BMI.

## 2023-08-20 DIAGNOSIS — E538 Deficiency of other specified B group vitamins: Secondary | ICD-10-CM | POA: Diagnosis not present

## 2023-09-11 NOTE — Congregational Nurse Program (Signed)
  Dept: [phone]   Congregational Nurse Program Note  Date of Encounter: 09/11/2023  Past Medical History: Past Medical History:  Diagnosis Date   Anemia    Anxiety    Colon adenomas    Depression    Gastric ulcer    GERD (gastroesophageal reflux disease)    Hyperlipidemia    Hyperlipidemia    Meningioma (HCC)    Seizures [facility])     Encounter Details:  Community Questionnaire - 09/11/23 2035       Questionnaire   Ask client: Do you give verbal consent for me to treat you today? Yes    Student Assistance N/A    [facility] S[facility]  [facility]    Encounter Setting CN site    Population Status Unknown    Insurance Medicare    Medication N/A    Medical Provider Yes    Screening Referrals Made N/A    Medical Referrals Made N/A    Medical Appointment Completed N/A    CNP Interventions Advocate/Support    Screenings CN Performed Blood Pressure;Blood Glucose    ED Visit Averted N/A    Life-Saving Intervention Made N/A           Today's Vitals   09/11/23 2034  BP: 110/78   There is no height or weight on file to calculate BMI.   Patient indicated that she needed a BP machine in order to keep track of her BP as encouraged by her Doctor.  BP machine with arm cuff  and BP log was provided to patient for daily use.  Patient was encouraged to keep track of daily and to show her doctor.  Patient verbalized understanding.  Alan Lenon PEAK

## 2023-09-22 DIAGNOSIS — D510 Vitamin B12 deficiency anemia due to intrinsic factor deficiency: Secondary | ICD-10-CM | POA: Diagnosis not present

## 2023-10-06 DIAGNOSIS — M3501 Sicca syndrome with keratoconjunctivitis: Secondary | ICD-10-CM | POA: Diagnosis not present

## 2023-10-06 DIAGNOSIS — H43813 Vitreous degeneration, bilateral: Secondary | ICD-10-CM | POA: Diagnosis not present

## 2023-10-06 DIAGNOSIS — Z961 Presence of intraocular lens: Secondary | ICD-10-CM | POA: Diagnosis not present

## 2023-10-06 DIAGNOSIS — D3132 Benign neoplasm of left choroid: Secondary | ICD-10-CM | POA: Diagnosis not present

## 2023-10-23 DIAGNOSIS — D510 Vitamin B12 deficiency anemia due to intrinsic factor deficiency: Secondary | ICD-10-CM | POA: Diagnosis not present

## 2023-10-23 DIAGNOSIS — Z23 Encounter for immunization: Secondary | ICD-10-CM | POA: Diagnosis not present

## 2023-11-24 DIAGNOSIS — E538 Deficiency of other specified B group vitamins: Secondary | ICD-10-CM | POA: Diagnosis not present

## 2023-12-29 DIAGNOSIS — E538 Deficiency of other specified B group vitamins: Secondary | ICD-10-CM | POA: Diagnosis not present
